# Patient Record
Sex: Female | Born: 1975 | Race: White | Hispanic: No | Marital: Married | Smoking: Never smoker
Health system: Southern US, Community
[De-identification: ages and names within clinical notes are randomized; demographics above are authoritative.]

## PROBLEM LIST (undated history)

## (undated) DIAGNOSIS — K859 Acute pancreatitis without necrosis or infection, unspecified: Secondary | ICD-10-CM

## (undated) DIAGNOSIS — O039 Complete or unspecified spontaneous abortion without complication: Secondary | ICD-10-CM

## (undated) DIAGNOSIS — E039 Hypothyroidism, unspecified: Secondary | ICD-10-CM

## (undated) DIAGNOSIS — R011 Cardiac murmur, unspecified: Secondary | ICD-10-CM

## (undated) DIAGNOSIS — Z8489 Family history of other specified conditions: Secondary | ICD-10-CM

## (undated) HISTORY — PX: MOHS SURGERY: SUR867

## (undated) HISTORY — PX: ENDOMETRIAL ABLATION: SHX621

## (undated) HISTORY — PX: DILATION AND CURETTAGE OF UTERUS: SHX78

## (undated) HISTORY — DX: Complete or unspecified spontaneous abortion without complication: O03.9

---

## 1998-06-14 ENCOUNTER — Other Ambulatory Visit: Admission: RE | Admit: 1998-06-14 | Discharge: 1998-06-14 | Payer: Self-pay | Admitting: Obstetrics and Gynecology

## 1999-05-27 ENCOUNTER — Inpatient Hospital Stay (HOSPITAL_COMMUNITY): Admission: AD | Admit: 1999-05-27 | Discharge: 1999-05-27 | Payer: Self-pay | Admitting: Obstetrics and Gynecology

## 1999-11-06 ENCOUNTER — Inpatient Hospital Stay (HOSPITAL_COMMUNITY): Admission: AD | Admit: 1999-11-06 | Discharge: 1999-11-08 | Payer: Self-pay | Admitting: Obstetrics and Gynecology

## 1999-11-08 ENCOUNTER — Encounter: Payer: Self-pay | Admitting: Obstetrics & Gynecology

## 1999-11-23 ENCOUNTER — Inpatient Hospital Stay (HOSPITAL_COMMUNITY): Admission: AD | Admit: 1999-11-23 | Discharge: 1999-11-27 | Payer: Self-pay | Admitting: Obstetrics and Gynecology

## 1999-11-25 ENCOUNTER — Encounter: Payer: Self-pay | Admitting: Obstetrics and Gynecology

## 1999-11-27 ENCOUNTER — Encounter: Payer: Self-pay | Admitting: Obstetrics and Gynecology

## 1999-12-08 ENCOUNTER — Encounter: Payer: Self-pay | Admitting: Obstetrics and Gynecology

## 1999-12-08 ENCOUNTER — Inpatient Hospital Stay (HOSPITAL_COMMUNITY): Admission: AD | Admit: 1999-12-08 | Discharge: 1999-12-08 | Payer: Self-pay | Admitting: Obstetrics and Gynecology

## 1999-12-10 ENCOUNTER — Inpatient Hospital Stay (HOSPITAL_COMMUNITY): Admission: AD | Admit: 1999-12-10 | Discharge: 1999-12-10 | Payer: Self-pay | Admitting: Obstetrics and Gynecology

## 1999-12-20 ENCOUNTER — Inpatient Hospital Stay (HOSPITAL_COMMUNITY): Admission: AD | Admit: 1999-12-20 | Discharge: 1999-12-23 | Payer: Self-pay | Admitting: Obstetrics and Gynecology

## 2001-07-24 ENCOUNTER — Other Ambulatory Visit: Admission: RE | Admit: 2001-07-24 | Discharge: 2001-07-24 | Payer: Self-pay | Admitting: Obstetrics and Gynecology

## 2002-09-14 ENCOUNTER — Other Ambulatory Visit: Admission: RE | Admit: 2002-09-14 | Discharge: 2002-09-14 | Payer: Self-pay | Admitting: Obstetrics and Gynecology

## 2003-05-07 ENCOUNTER — Emergency Department (HOSPITAL_COMMUNITY): Admission: EM | Admit: 2003-05-07 | Discharge: 2003-05-07 | Payer: Self-pay | Admitting: Emergency Medicine

## 2003-09-22 ENCOUNTER — Other Ambulatory Visit: Admission: RE | Admit: 2003-09-22 | Discharge: 2003-09-22 | Payer: Self-pay | Admitting: Obstetrics and Gynecology

## 2003-12-25 ENCOUNTER — Emergency Department (HOSPITAL_COMMUNITY): Admission: EM | Admit: 2003-12-25 | Discharge: 2003-12-26 | Payer: Self-pay | Admitting: Emergency Medicine

## 2004-01-05 ENCOUNTER — Encounter: Admission: RE | Admit: 2004-01-05 | Discharge: 2004-01-05 | Payer: Self-pay | Admitting: Family Medicine

## 2004-01-11 ENCOUNTER — Ambulatory Visit (HOSPITAL_COMMUNITY): Admission: RE | Admit: 2004-01-11 | Discharge: 2004-01-11 | Payer: Self-pay | Admitting: Family Medicine

## 2004-01-14 HISTORY — PX: LAPAROSCOPIC CHOLECYSTECTOMY: SUR755

## 2004-01-19 ENCOUNTER — Observation Stay (HOSPITAL_COMMUNITY): Admission: RE | Admit: 2004-01-19 | Discharge: 2004-01-20 | Payer: Self-pay | Admitting: General Surgery

## 2004-01-19 ENCOUNTER — Encounter (INDEPENDENT_AMBULATORY_CARE_PROVIDER_SITE_OTHER): Payer: Self-pay | Admitting: Specialist

## 2005-08-14 ENCOUNTER — Ambulatory Visit: Payer: Self-pay | Admitting: Family Medicine

## 2005-08-31 ENCOUNTER — Ambulatory Visit: Payer: Self-pay | Admitting: Family Medicine

## 2005-10-15 HISTORY — PX: ANTERIOR AND POSTERIOR REPAIR: SHX1172

## 2005-10-18 ENCOUNTER — Ambulatory Visit: Payer: Self-pay | Admitting: Family Medicine

## 2005-10-31 ENCOUNTER — Observation Stay (HOSPITAL_COMMUNITY): Admission: RE | Admit: 2005-10-31 | Discharge: 2005-11-01 | Payer: Self-pay | Admitting: Obstetrics and Gynecology

## 2006-09-17 ENCOUNTER — Ambulatory Visit: Payer: Self-pay | Admitting: Family Medicine

## 2007-08-08 DIAGNOSIS — J309 Allergic rhinitis, unspecified: Secondary | ICD-10-CM | POA: Insufficient documentation

## 2008-02-26 ENCOUNTER — Telehealth: Payer: Self-pay | Admitting: Family Medicine

## 2008-02-27 ENCOUNTER — Telehealth (INDEPENDENT_AMBULATORY_CARE_PROVIDER_SITE_OTHER): Payer: Self-pay | Admitting: *Deleted

## 2008-03-03 ENCOUNTER — Ambulatory Visit: Payer: Self-pay | Admitting: Family Medicine

## 2008-03-03 DIAGNOSIS — L258 Unspecified contact dermatitis due to other agents: Secondary | ICD-10-CM

## 2008-03-03 DIAGNOSIS — K8020 Calculus of gallbladder without cholecystitis without obstruction: Secondary | ICD-10-CM | POA: Insufficient documentation

## 2008-03-09 ENCOUNTER — Telehealth: Payer: Self-pay | Admitting: *Deleted

## 2008-03-09 ENCOUNTER — Ambulatory Visit: Payer: Self-pay | Admitting: Family Medicine

## 2008-03-12 ENCOUNTER — Telehealth: Payer: Self-pay | Admitting: Family Medicine

## 2008-03-13 ENCOUNTER — Emergency Department (HOSPITAL_COMMUNITY): Admission: EM | Admit: 2008-03-13 | Discharge: 2008-03-13 | Payer: Self-pay | Admitting: Emergency Medicine

## 2008-03-15 ENCOUNTER — Ambulatory Visit: Payer: Self-pay | Admitting: Family Medicine

## 2008-03-15 LAB — CONVERTED CEMR LAB
Blood in Urine, dipstick: NEGATIVE
Ketones, urine, test strip: NEGATIVE
Nitrite: NEGATIVE
Protein, U semiquant: NEGATIVE
Specific Gravity, Urine: <1.005
Urobilinogen, UA: 0.2
WBC Urine, dipstick: NEGATIVE

## 2008-04-12 ENCOUNTER — Ambulatory Visit: Payer: Self-pay | Admitting: Family Medicine

## 2008-04-12 DIAGNOSIS — L509 Urticaria, unspecified: Secondary | ICD-10-CM | POA: Insufficient documentation

## 2008-04-12 DIAGNOSIS — N63 Unspecified lump in unspecified breast: Secondary | ICD-10-CM | POA: Insufficient documentation

## 2008-04-12 LAB — CONVERTED CEMR LAB
ALT: 13 units/L (ref 0–35)
Anti Nuclear Antibody(ANA): NEGATIVE
Basophils Absolute: 0.1 10*3/uL (ref 0.0–0.1)
Basophils Relative: 0.9 % (ref 0.0–1.0)
CO2: 29 meq/L (ref 19–32)
Calcium: 9.1 mg/dL (ref 8.4–10.5)
Creatinine, Ser: 0.9 mg/dL (ref 0.4–1.2)
GFR calc Af Amer: 93 mL/min
Glucose, Bld: 110 mg/dL — ABNORMAL HIGH (ref 70–99)
HCT: 39.6 % (ref 36.0–46.0)
Hemoglobin: 13.7 g/dL (ref 12.0–15.0)
IgE (Immunoglobulin E), Serum: 9.4 intl units/mL (ref 0.0–180.0)
Lymphocytes Relative: 30.8 % (ref 12.0–46.0)
MCHC: 34.6 g/dL (ref 30.0–36.0)
Monocytes Absolute: 0.5 10*3/uL (ref 0.1–1.0)
Neutro Abs: 4.2 10*3/uL (ref 1.4–7.7)
RBC: 4.42 M/uL (ref 3.87–5.11)
RDW: 12.6 % (ref 11.5–14.6)
Sodium: 141 meq/L (ref 135–145)
Total Bilirubin: 0.7 mg/dL (ref 0.3–1.2)
Total Protein: 7.3 g/dL (ref 6.0–8.3)
hCG, Beta Chain, Quant, S: 1.18 milliintl units/mL

## 2008-04-15 ENCOUNTER — Encounter: Admission: RE | Admit: 2008-04-15 | Discharge: 2008-04-15 | Payer: Self-pay | Admitting: Family Medicine

## 2008-04-20 ENCOUNTER — Telehealth: Payer: Self-pay | Admitting: Family Medicine

## 2008-06-17 ENCOUNTER — Telehealth: Payer: Self-pay | Admitting: Family Medicine

## 2008-06-22 ENCOUNTER — Ambulatory Visit (HOSPITAL_COMMUNITY): Admission: RE | Admit: 2008-06-22 | Discharge: 2008-06-22 | Payer: Self-pay | Admitting: Obstetrics and Gynecology

## 2008-07-31 ENCOUNTER — Inpatient Hospital Stay (HOSPITAL_COMMUNITY): Admission: AD | Admit: 2008-07-31 | Discharge: 2008-07-31 | Payer: Self-pay | Admitting: Obstetrics

## 2008-12-27 DIAGNOSIS — J020 Streptococcal pharyngitis: Secondary | ICD-10-CM

## 2008-12-30 ENCOUNTER — Ambulatory Visit: Payer: Self-pay | Admitting: Family Medicine

## 2008-12-30 DIAGNOSIS — J029 Acute pharyngitis, unspecified: Secondary | ICD-10-CM

## 2009-02-18 ENCOUNTER — Telehealth: Payer: Self-pay | Admitting: Family Medicine

## 2009-02-21 ENCOUNTER — Telehealth: Payer: Self-pay | Admitting: Speech Pathology

## 2010-05-15 DIAGNOSIS — D229 Melanocytic nevi, unspecified: Secondary | ICD-10-CM

## 2010-05-15 HISTORY — DX: Melanocytic nevi, unspecified: D22.9

## 2010-11-29 ENCOUNTER — Encounter (INDEPENDENT_AMBULATORY_CARE_PROVIDER_SITE_OTHER): Payer: Self-pay | Admitting: *Deleted

## 2010-11-29 ENCOUNTER — Other Ambulatory Visit: Payer: Self-pay | Admitting: Family Medicine

## 2010-11-29 ENCOUNTER — Other Ambulatory Visit: Payer: PRIVATE HEALTH INSURANCE

## 2010-11-29 DIAGNOSIS — Z0000 Encounter for general adult medical examination without abnormal findings: Secondary | ICD-10-CM

## 2010-11-29 LAB — CBC WITH DIFFERENTIAL/PLATELET
Basophils Relative: 0.4 % (ref 0.0–3.0)
Eosinophils Relative: 5.1 % — ABNORMAL HIGH (ref 0.0–5.0)
HCT: 39.2 % (ref 36.0–46.0)
Hemoglobin: 13.5 g/dL (ref 12.0–15.0)
Lymphs Abs: 1.7 10*3/uL (ref 0.7–4.0)
MCHC: 34.5 g/dL (ref 30.0–36.0)
MCV: 89.2 fl (ref 78.0–100.0)
Monocytes Absolute: 0.6 10*3/uL (ref 0.1–1.0)
Neutro Abs: 3.6 10*3/uL (ref 1.4–7.7)
Neutrophils Relative %: 57.4 % (ref 43.0–77.0)
RBC: 4.4 Mil/uL (ref 3.87–5.11)
WBC: 6.3 10*3/uL (ref 4.5–10.5)

## 2010-11-29 LAB — LIPID PANEL
LDL Cholesterol: 79 mg/dL (ref 0–99)
Total CHOL/HDL Ratio: 3

## 2010-11-29 LAB — URINALYSIS
Bilirubin Urine: NEGATIVE
Nitrite: NEGATIVE
Total Protein, Urine: NEGATIVE
Urobilinogen, UA: 0.2 (ref 0.0–1.0)

## 2010-11-29 LAB — HEPATIC FUNCTION PANEL
ALT: 10 U/L (ref 0–35)
Albumin: 3.6 g/dL (ref 3.5–5.2)
Bilirubin, Direct: 0.1 mg/dL (ref 0.0–0.3)
Total Protein: 6.4 g/dL (ref 6.0–8.3)

## 2010-11-29 LAB — BASIC METABOLIC PANEL
CO2: 29 mEq/L (ref 19–32)
Chloride: 105 mEq/L (ref 96–112)
Potassium: 4.8 mEq/L (ref 3.5–5.1)

## 2010-12-04 ENCOUNTER — Ambulatory Visit (INDEPENDENT_AMBULATORY_CARE_PROVIDER_SITE_OTHER): Payer: PRIVATE HEALTH INSURANCE | Admitting: Family Medicine

## 2010-12-04 ENCOUNTER — Encounter: Payer: Self-pay | Admitting: Family Medicine

## 2010-12-04 DIAGNOSIS — Z23 Encounter for immunization: Secondary | ICD-10-CM

## 2010-12-04 DIAGNOSIS — Z0000 Encounter for general adult medical examination without abnormal findings: Secondary | ICD-10-CM

## 2010-12-04 DIAGNOSIS — J309 Allergic rhinitis, unspecified: Secondary | ICD-10-CM

## 2010-12-04 DIAGNOSIS — E039 Hypothyroidism, unspecified: Secondary | ICD-10-CM

## 2010-12-04 DIAGNOSIS — L509 Urticaria, unspecified: Secondary | ICD-10-CM

## 2010-12-04 MED ORDER — LEVOTHYROXINE SODIUM 75 MCG PO TABS: 75.0000 ug | ORAL_TABLET | Freq: Every day | ORAL | Status: DC

## 2010-12-04 NOTE — Progress Notes (Signed)
  Subjective:    Patient ID: Kayla Mccarthy, female    DOB: 1976-04-09, 35 y.o.   MRN: [id number]  HPI Annaleigh is a delightful 35 year old, married female, nonsmoker, who comes in today for general medical examination.  She has a history of hypothyroidism.  She is on Synthroid one daily.  TSH level normal.  She has a history of urticaria, etiology unknown.  Allergy workup was nondiagnostic.  She takes Zyrtec 10 mg nightly and this has mostly alleviated her symptoms.  She gets routine eye care, dental care, BSE monthly, she did have a mammogram a couple years ago, when she had a breast cyst.  Mammogram showed a cystic lesion that is since reabsorbed and is no longer present.  Pap done by GYN.  Also as she sees a dermatologist once or twice a year for skin exam because of some history of some dysplastic nevi.  No birth control.  She is not gotten pregnant in over 10 years   Review of Systems  Constitutional: Negative.   HENT: Negative.   Eyes: Negative.   Respiratory: Negative.   Cardiovascular: Negative.   Gastrointestinal: Negative.   Genitourinary: Negative.   Musculoskeletal: Negative.   Neurological: Negative.   Hematological: Negative.   Psychiatric/Behavioral: Negative.        Objective:   Physical Exam  Constitutional: She appears well-developed and well-nourished.  HENT:  Head: Normocephalic and atraumatic.  Right Ear: External ear normal.  Left Ear: External ear normal.  Nose: Nose normal.  Mouth/Throat: Oropharynx is clear and moist.  Eyes: EOM are normal. Pupils are equal, round, and reactive to light.  Neck: Normal range of motion. Neck supple. No thyromegaly present.  Cardiovascular: Normal rate, regular rhythm, normal heart sounds and intact distal pulses.  Exam reveals no gallop and no friction rub.   No murmur heard. Pulmonary/Chest: Effort normal and breath sounds normal.  Abdominal: Soft. Bowel sounds are normal. She exhibits no distension and no mass. There  is no tenderness. There is no rebound.  Musculoskeletal: Normal range of motion.  Lymphadenopathy:    She has no cervical adenopathy.  Neurological: She is alert. She has normal reflexes. No cranial nerve deficit. She exhibits normal muscle tone. Coordination normal.  Skin: Skin is warm and dry.       Total body skin exam normal.  She has a garden variety of freckles moles skin tags.  Capillary hemangiomas and a blue nevus on her right breast.  Nothing that looks abnormal  Psychiatric: She has a normal mood and affect. Her behavior is normal. Judgment and thought content normal.          Assessment & Plan:  Healthy female.  History of hypothyroidism.  Continue levothyroxine.  History of urticaria and allergic rhinitis, etiology unknown.  Plan continue the Zyrtec, 10 mg nightly  History of dysplastic nevi.  Plan wear sunscreens continue to monitor skin, carefully

## 2010-12-04 NOTE — Patient Instructions (Signed)
Continue current medications follow-up in one year or sooner if any problems.  Remember to do a good breast exam monthly and also check your skin, monthly

## 2010-12-08 ENCOUNTER — Telehealth: Payer: Self-pay | Admitting: Family Medicine

## 2010-12-08 NOTE — Telephone Encounter (Signed)
Pt called to check on status of physical form that she had faxed yesterday for Dr Tawanna Cooler to sign off on.... Pt can be reached at  [phone] when form is ready for p/u..... Or Form can be faxed to [phone]  When completed.

## 2010-12-11 NOTE — Telephone Encounter (Signed)
patient  Is aware that form has been faxed and copy mailed to home

## 2010-12-26 ENCOUNTER — Ambulatory Visit: Payer: PRIVATE HEALTH INSURANCE | Admitting: Family Medicine

## 2010-12-26 DIAGNOSIS — Z0289 Encounter for other administrative examinations: Secondary | ICD-10-CM

## 2011-01-05 ENCOUNTER — Ambulatory Visit (HOSPITAL_COMMUNITY)
Admission: RE | Admit: 2011-01-05 | Discharge: 2011-01-05 | Disposition: A | Discharge status: Home / Self Care | Payer: PRIVATE HEALTH INSURANCE | Source: Ambulatory Visit | Attending: Obstetrics and Gynecology | Admitting: Obstetrics and Gynecology

## 2011-01-05 ENCOUNTER — Other Ambulatory Visit: Payer: Self-pay | Admitting: Obstetrics and Gynecology

## 2011-01-05 DIAGNOSIS — N920 Excessive and frequent menstruation with regular cycle: Secondary | ICD-10-CM | POA: Insufficient documentation

## 2011-01-05 DIAGNOSIS — N840 Polyp of corpus uteri: Secondary | ICD-10-CM | POA: Insufficient documentation

## 2011-01-05 LAB — CBC
HCT: 41.5 % (ref 36.0–46.0)
MCHC: 33 g/dL (ref 30.0–36.0)
MCV: 89.1 fL (ref 78.0–100.0)
RDW: 12.8 % (ref 11.5–15.5)

## 2011-02-02 NOTE — Op Note (Signed)
  [name], Kayla Mccarthy                ACCOUNT NO.:  [id number]  MEDICAL RECORD NO.:  [id number]           PATIENT TYPE:  O  LOCATION:  [facility]                          FACILITY:  [facility]  PHYSICIAN:  Lenoard Aden, M.D.DATE OF BIRTH:  [birth year]  DATE OF PROCEDURE:  01/05/2011 DATE OF DISCHARGE:                              OPERATIVE REPORT   PREOPERATIVE DIAGNOSIS:  Refractory menometrorrhagia.  POSTOPERATIVE DIAGNOSIS:  Refractory menometrorrhagia.  PROCEDURE:  Diagnostic hysteroscopy, D and C, endometrial polypectomy, NovaSure endometrial ablation.  SURGEON:  Lenoard Aden, MD  ASSISTANT:  None.  ANESTHESIA:  General and local.  ESTIMATED BLOOD LOSS:  Less than 50 mL.  FLUID DEFICIT:  35 mL.  COMPLICATIONS:  None.  DRAINS:  None.  COUNTS:  Correct.  The patient recovery in good condition.  SPECIMEN:  Endometrial curettings and polyp to pathology.  BRIEF OPERATIVE NOTE:  After being apprised of risks of anesthesia, infection, bleeding, injury to abdominal organs, need for repair, delayed versus immediate complications to include bowel and bladder injury, possible need for repair.  The patient was brought to the operating room where she was administered a general anesthetic without complications, prepped and draped in sterile fashion, catheterized until the bladder was empty.  Exam under anesthesia reveals an anteflexed, normal size uterus with no adnexal masses.  A single-tooth tenaculum was placed.  A dilute paracervical block placed with a 20 mL of dilute Marcaine solution, at this time cervix was easily dilated up to #21 Pratt dilator.  Hysteroscope placed.  Visualization reveals a right lateral wall endometrial polyp which was removed using polyp forceps and sent with the endometrial curetting specimen which was then collected using curettage in a four-quadrant method.  Good hemostasis was noted. Visualization reveals a clear endometrial cavity, at this time,  the endometrial device was placed for a cavity length of 6.5 cavity width of 4.7.  CO2 test was negative.  NovaSure device was then initiated. Procedure was started whereby the power was 268 watts/80 seconds.  At the end of the procedure, the device was removed, inspected and found to be intact.  Revisualization of the endometrial cavity reveals a well-ablated cavity and no evidence of uterine perforation.  Good hemostasis was noted.  The patient tolerated the procedure well.  All instruments removed.  The patient was transferred to recovery room in good condition.     Lenoard Aden, M.D.     RJT/MEDQ  D:  01/05/2011  T:  01/06/2011  Job:  [id number]  Electronically Signed by Olivia Mackie M.D. on 02/02/2011 03:16:30 PM

## 2011-02-02 NOTE — H&P (Signed)
  NAMEJAMMI, Kayla Mccarthy                ACCOUNT NO.:  [id number]  MEDICAL RECORD NO.:  [id number]           PATIENT TYPE:  LOCATION:                                 FACILITY:  PHYSICIAN:  Lenoard Aden, M.D.DATE OF BIRTH:  01/11/1976  DATE OF ADMISSION: DATE OF DISCHARGE:                             HISTORY & PHYSICAL   CHIEF COMPLAINT:  Refractory menometrorrhagia.  She is a 35 year old white female, G5, P2, who presents now for management of continued menometrorrhagia.  She has had normal lab work to include normal TSH and prolactin, and a ultrasound revealing otherwise normal-appearing endometrial cavity with no evidence of structural lesions and a normal adnexa.  She has medications to include Zyrtec as needed, thyroid supplementation, and vitamin B.  She has allergies to Klonopin, no known latex allergies.  Family history of heart disease and kidney cancer.  She has a history of two vaginal deliveries.  She has a history of an anterior colporrhaphy in 2007 and cholecystectomy.  PHYSICAL EXAMINATION:  GENERAL:  She is a well-developed, well-nourished white female. VITAL SIGNS:  Weight of 173.  Height of 5 feet 5 inches. HEENT:  Normal. NECK:  Supple.  Full range of motion. LUNGS:  Clear. ABDOMEN:  Soft, nontender.  Uterus is small, anteflexed.  No adnexal masses. EXTREMITIES:  There are no cords. NEUROLOGIC:  Nonfocal. SKIN:  Intact.  IMPRESSION:  Symptomatic menometrorrhagia.  PLAN:  Proceed with diagnostic hysteroscopy, D and C, NovaSure endometrial ablation.  Risks of anesthesia, infection, bleeding, injury to abdominal organs, need for repair discussed, delayed versus immediate complications to include bowel or bladder injury noted.  Amenorrhea rate of approximately 70-75% and 5 years escorted.     Lenoard Aden, M.D.     RJT/MEDQ  D:  01/04/2011  T:  01/04/2011  Job:  [id number]  Electronically Signed by Olivia Mackie M.D. on 02/02/2011 03:16:23  PM

## 2011-02-27 NOTE — Assessment & Plan Note (Signed)
[facility]                                 ON-CALL NOTE   RINNAH, PEPPEL                         MRN:          [id number]  DATE:03/07/2008                            DOB:          [birth year]    Time of call 5:37 pm.   PHONE NUMBER:  [phone].   OBJECTIVE:  The patient is having an allergic reaction, was seen by Dr.  Tawanna Cooler last week, and is finishing her steroids.  As she is tapering, her  symptoms are coming back.  She was on prednisone 40 mg for three days  and now has been on 20 mg yesterday and today.  Yesterday, her symptoms  were milder, but today more pronounced with ears swollen and arms  blotchy red.  Neck is tender.  Being in the shower, the pressure of the  water causes her skin to hurt.  Her tongue is tingly.  She has itchiness  on the face and mild numbness.  The throat feels swollen to her, one  side worse than the other.  She is on Claritin regularly and has been  for years.  Her history is significant as far as she can tell, and as  far as my questioning, only significant for the fact that she started  Clomid last month.  She had her second dose approximately 10 days ago  now, which would be consistent with about the time that her symptoms  started.   ASSESSMENT:  Allergic reaction.   PLAN:  I suggest that she switch Claritin to Zyrtec.  Add Pepcid 40 mg  b.i.d.  If she has breathing problems, go to the emergency room.  If  things are no better by Tuesday, go in and be seen by Dr. Tawanna Cooler again and  consider not taking the Clomid for few months, to see if her symptoms  resolve, could then give herself a challenge again if she so desired.   Primary care Luiz Trumpower is Dr. Tawanna Cooler and home office is [place].     Arta Silence, MD  Electronically Signed    RNS/MedQ  DD: 03/07/2008  DT: 03/08/2008  Job #: [id number]

## 2011-03-02 NOTE — Op Note (Signed)
Kayla Mccarthy, Kayla Mccarthy                          ACCOUNT NO.:  [id number]   MEDICAL RECORD NO.:  [id number]                   PATIENT TYPE:  OBV   LOCATION:  0448                                 FACILITY:  [facility]   PHYSICIAN:  Leonie Man, M.D.                DATE OF BIRTH:  1976-09-04   DATE OF PROCEDURE:  01/19/2004  DATE OF DISCHARGE:  01/20/2004                                 OPERATIVE REPORT   PREOPERATIVE DIAGNOSES:  Biliary dyskinesia.   POSTOPERATIVE DIAGNOSES:  Biliary dyskinesia.   PROCEDURE:  Laparoscopic cholecystectomy with attempted cholangiogram.   SURGEON:  Leonie Man, M.D.   ASSISTANT:  Nurse.   ANESTHESIA:  General.   The patient is a 35 year old female who developed recurrent upper abdominal  pain with radiation to the right subscapular region.  This was associated  with nausea and vomiting and usually occurred following fat intake.  She  underwent a workup for cholelithiasis which was negative. Her symptoms were  continued and she had a HIDA scan with CCK stimulation. The HIDA scan showed  that she had only an 8% gallbladder ejection fraction for diagnosis of  biliary dyskinesia. She comes to the operating room now after the risk and  potential benefits of surgery have been fully discussed with her. All  questions answered and consent obtained.   DESCRIPTION OF PROCEDURE:  Following the induction of satisfactory general  anesthesia, the patient's positioned supinely. The abdomen is routinely  prepped and draped to be included in a sterile operative field.  Open  laparoscopy is created through an infraumbilical incision with insertion of  a Hasson type cannula. The peritoneum is inflated to 14-15 mmHg pressure  using carbon dioxide. The camera is inserted and visual exploration of the  abdomen carried out. The gallbladder showed some mild chronic scarring but  without any significant adhesions.  The liver edges were sharp, liver  surfaces smooth. The  anterior gastric wall and duodenal sweep appeared to be  normal. None of the small or large intestine appeared to be abnormal.  Under  direct vision, the epigastric and lateral ports were placed. The gallbladder  was grasped and retracted cephalad. Dissection was carried down at the  region of the ampulla of the gallbladder with isolation of the cystic duct  and of the cystic artery.  The cystic duct was __________ proximally and  opened with good return of bile. There were no stones within the cystic duct  on milking of the bile duct. A Cook catheter was passed into the abdomen;  however, a valve at the cystic duct it was not possible to pass the catheter  distally.  This was removed and Reddick catheter then tried. The Reddick  catheter was also not able to pass therefore an attempt at cholangiogram was  discontinued.  The cystic duct was freed completely and the window was  between the cystic duct and the liver  was noted to be wide without any  evidence of any structures behind it. The common duct could be clearly seen.  The cystic duct was then doubly clipped and then transected. The cystic  artery was isolated and doubly clipped and then transected. The gallbladder  was then dissected free from the liver bed using electrocautery. During the  course of the dissection, there was a focal small hole made in the  gallbladder.  There were no stones within the gallbladder. The gallbladder  was dissected free, it was placed in an Endopouch. The right upper quadrant  was then thoroughly irrigated, all the areas of dissection in the liver bed  checked for hemostasis and noted to be dry.  The camera was then turned to  the epigastric port and the gallbladder was retrieved through the umbilical  port without difficulty. The right upper quadrant was then thoroughly  irrigated with normal saline and aspirated dry. Sponge, instrument and sharp  counts were verified and the wounds closed in layers as  follows. The  umbilical wound was closed in two layers with #0 Dexon and 4-0 Dexon.  Epigastric and lateral flank.   WOUNDS:  Were closed with 4-0 Monocryl suture. The wounds were then  reinforced with Steri-Strips and sterile dressings were applied. The  anesthetic reversed and the patient removed from the operating room to the  recovery room in stable condition. She tolerated the procedure well.                                               Leonie Man, M.D.    PB/MEDQ  D:  01/19/2004  T:  01/20/2004  Job:  [id number]   cc:   Tinnie Gens A. Tawanna Cooler, M.D. [facility]

## 2011-03-02 NOTE — H&P (Signed)
[facility]  Patient:    Kayla Mccarthy, Kayla Mccarthy                       MRN: [id number] Adm. Date:  [id number] Attending:  Lenoard Aden                         History and Physical  CHIEF COMPLAINT:              Persistent oligohydramnios.  HISTORY OF PRESENT ILLNESS:   Patient is a 35 year old white female, G5, 1-0-3-1, who presents with worsening oligohydramnios, previously hospitalized with pregnancy for oligohydramnios.  Ultrasound has shown appropriate-for-gestational-age fetus with decreasing AFIs of 5.2, 6.2 and 6.7 today; frank breech presentation noted  today.  PAST MEDICAL HISTORY:         Remarkable for history of cystitis, history of vaginal delivery x 1, history of three pregnancy losses, three spontaneous losses with one D&C.  Otherwise, patient denies any medical or surgical hospitalizations.  FAMILY HISTORY:               Remarkable for heart disease, hypertension, tuberculosis, diabetes, thyroid disease, gastroenteritis and psychiatric disease.  ALLERGIES:                    She has no known drug allergies.  MEDICATIONS:                  Prenatal vitamins.  PRENATAL LABORATORY DATA:     Blood type A-positive.  Rh-antibody negative. VDRL nonreactive.  Rubella immune.  Hepatitis B surface antigen negative.  HIV nonreactive.  Pap smear normal.  Triple screen within normal limits.  One-hour TT performed at 28 weeks was normal.  PHYSICAL EXAMINATION:  GENERAL:                      Patient is a well-developed, well-nourished white  female in no apparent distress.  HEENT:                        Normal.  LUNGS:                        Clear.  HEART:                        Regular rhythm.  ABDOMEN:                      Soft, gravid, nontender.  Estimated fetal weight n ultrasound:  Five pounds 15 ounces.  Umbilical artery Dopplers also noted to be  normal.  BACK:                         No CVA tenderness.  PELVIC:                        Cervical exam deferred.  EXTREMITIES:                  No cords.  NEUROLOGIC:                   Nonfocal.  IMPRESSION:                   1. Thirty-three-plus-week intrauterine pregnancy.  2. Recurrent oligohydramnios.                               3. Homero Fellers breech.  PLAN:                         Admit; continuous monitoring; repeat AFI in 48 hours. DD:  11/23/99 TD:  11/23/99 Job: [id number]

## 2011-03-02 NOTE — H&P (Signed)
[facility]  Patient:    Kayla Mccarthy                        MRN: [id number] Adm. Date:  [id number] Attending:  Lenoard Aden                         History and Physical  CHIEF COMPLAINT:              Persistent oligohydramnios.  HISTORY OF PRESENT ILLNESS:   The patient is a 35 year old white female gravida V, para 1, 0, 3, 1 with an estimated date of delivery of January 06, 2000 at 31-2/7 weeks, and with a history of ongoing oligohydramnios throughout this pregnancy.  The patient is now admitted for an AFI less than the second percentile.  PAST MEDICAL HISTORY:         Past medical history is remarkable for a history f cystitis.  No other medical or surgical hospitalizations.  FAMILY HISTORY:               Family history is remarkable for heart disease, hypertension, tuberculosis, diabetes, thyroid disease, gastroenteritis, and psychiatric disease.  PAST OBSTETRICAL HISTORY:     Past obstetrical history is remarkable for thee uncomplicated first trimester and miscarriages, and one induced vaginal delivery of an 8 pound 1 ounce female in 1998; induction for oligohydramnios and pregnancy was also complicated by mild pregnancy-induced hypertension.  PRENATAL LABORATORY DATA:     Blood type A positive, Rh antibody negative. VDRL nonreactive.  Rubella immune.  Hepatitis B surface antigen negative.  HIV nonreactive.  Pap smear normal.  Triple screen normal. One-hour glucose tolerance test performed was 82.  The patient had an ultrasound performed today, which revealed an appropriate for gestational age fetus at 4 pounds 7 ounces, AFI of .7, 6.3 and 6.4; all less than the second percentile.  SOCIAL HISTORY:               Social history is otherwise noncontributory.  The  patient denies domestic or physical violence.  PHYSICAL EXAMINATION:  GENERAL APPEARANCE:           On physical examination the patient is a well-developed,  well-nourished white female in no apparent distress.  HEENT:                        Head, eyes, ears, nose, and throat normal.  LUNGS:                        Lungs clear.  HEART:                        Regular rate and rhythm.  ABDOMEN:                      Abdomen soft, gravid and nontender.  Estimated fetal weight 4.5 pounds.  VAGINAL EXAMINATION:          Cervix closed, 2.5-3 cm long and floating.  EXTREMITIES:                  Extremities show no cords.  NEUROLOGIC:                   Neurological examination is nonfocal.  IMPRESSION:  1. Thirty-one-and-two-sevenths weeks intrauterine                                  pregnancy.                               2. Persistent oligohydramnios.                               3. History of oligohydramnios and pregnancy-induced                                  hypertension in previous pregnancy.  PLAN:                         1. Admit.                               2. Bedrest with bathroom privileges.                               3. Intravenous fluid hydration.                               4. Repeat AFI at 48 hours.                               5. Betamethasone to be given. DD:  11/06/99 TD:  11/06/99 Job: [id number]

## 2011-03-02 NOTE — Discharge Summary (Signed)
[facility]  Patient:    Kayla Mccarthy, Kayla Mccarthy                       MRN: [id number] Adm. Date:  [id number] Disc. Date: [id number] Attending:  Lenoard Aden                           Discharge Summary  DISCHARGE DIAGNOSES:          1. Intrauterine pregnancy at term.                               2. Unfavorable cervix.                               3. Oligohydramnios.                               4. Pregnancy-induced hypertension.  PROCEDURE:                    Spontaneous vaginal delivery, December 21, 1999. Vaginal Cytotec, December 20, 1999.  Pitocin induction, December 21, 1999.  HISTORY OF PRESENT ILLNESS:   A 35 year old woman, gravida 5, para 1-0-3-1, who  experienced recurrent oligohydramnios at term.  The patient had been previously  hospitalized for oligohydramnios.  Blood pressure was elevated in the office on  December 20, 1999, and the decision was made to induce.  EDC of January 06, 2000.  HOSPITAL COURSE:              The patients cervix was unfavorable.  She was admitted to [facility] [place] on December 20, 1999.  Cytotec was used to ripen her cervix.  Pitocin was initiated the following morning.  Labor progressed well with a second stage of 11 minutes.  Epidural had been given during the active phase of labor.  She was delivered spontaneously of a live female, 3610 grams with Apgars of 9 and 9, over an intact perineum.  Estimated blood loss was 350 cc and there were no complications.  The patients postpartum course was unremarkable.  Her hemoglobin stabilized at 10.3.  This was well tolerated.  Pregnancy-induced hypertension laboratory studies have previously been normal.  She chose to bottlefeed.  She was discharged to home in satisfactory condition on December 23, 1999.  Routine status post vaginal delivery instructions were given.  Follow-up will be in the office in four to six weeks ith Lenoard Aden, M.D.  DISCHARGE MEDICATIONS:         1. Prenatal vitamins.                               2. Iron.                               3. Nonsteroidal anti-inflammatory agents.                               4. She was given a prescription for Lo-Ovral, but was  asked not to take it until she was seen by                                  Lenoard Aden, M.D. DD:  01/13/00 TD:  01/13/00 Job: [id number]

## 2011-03-02 NOTE — Op Note (Signed)
NAMESINA, Kayla Mccarthy                ACCOUNT NO.:  [id number]   MEDICAL RECORD NO.:  [id number]          PATIENT TYPE:  OBV   LOCATION:  9307                          FACILITY:  [facility]   PHYSICIAN:  Lenoard Aden, M.D.DATE OF BIRTH:  29-Jul-1976   DATE OF PROCEDURE:  10/31/2005  DATE OF DISCHARGE:                                 OPERATIVE REPORT   PREOPERATIVE DIAGNOSIS:  Symptomatic cystocele and rectocele.   POSTOPERATIVE DIAGNOSES:  1.  Symptomatic cystocele and rectocele.  2.  Enterocele.   PROCEDURE:  1.  Anterior colporrhaphy.  2.  Posterior colporrhaphy.  3.  Anterior repair.  4.  Perineorrhaphy.   ASSISTANT:  Genia Del, M.D.   ANESTHESIA:  General.   ESTIMATED BLOOD LOSS:  100 mL.   COMPLICATIONS:  None.   DRAINS:  Foley catheter.   COUNTS:  Correct.   DISPOSITION:  Patient to recovery in good condition.   BRIEF OPERATIVE NOTE:  Being apprised of the risks of anesthesia, infection,  bleeding, injury to bowel and bladder, and possible need for repair, she was  brought to the operating room where she was administered general anesthetic  without complications.  Was prepped and draped in the usual sterile fashion.  Foley catheter placed.  Cystocele was identified.  Anterior vaginal mucosa  was undermined using a dilute Xylocaine and epinephrine solution and  anterior and posterior portion of the cystocele grasped using Allis clamps  down to the cervical vaginal junction.  The anterior vaginal mucosa is  incised and the pubovesicocervical fascia is dissected sharply off the  anterior vaginal wall.  This is then reduced sharply using Strully scissors.  The cystocele was then purse-stringed using 0 Vicryl suture x2 and reduced  using 0 Vicryl suture.  The vagina is trimmed and defect is closed using 3-0  Vicryl in an interrupted fashion.  Attention was turned to the rectocele,  which was identified by doing rectal exam and tensioning the anterior most  portion which was then marked with an Allis clamp.  Tissue was undermined.  Perineal relaxation was noted.  The enterocele is also suspected at this  time.  The triangular wedge of tissue was removed on the perineum and the  posterior vaginal wall mucosa is undermined in the standard fashion and  incised in the midline and the rectovaginal fascia is dissected sharply off  of the posterior vaginal wall.  Enterocele is identified at the apex of the  defect and this is closed using a 0 Vicryl purse-string suture x2.  Rectocele is reduced in the standard fashion using the 0 Vicryl suture.  The  vagina is trimmed.  The posterior vaginal wall is closed using 2-0 Vicryl in  an interrupted fashion.  Perineorrhaphy is then performed in the standard  fashion using a 2-0 Vicryl suture.  The patient tolerates the procedure  well.  Packing was placed.  The patient is awakened and transferred to  recovery in good condition.      Lenoard Aden, M.D.  Electronically Signed     RJT/MEDQ  D:  10/31/2005  T:  10/31/2005  Job:  [id number]

## 2011-03-02 NOTE — Discharge Summary (Signed)
[facility]  Patient:    Kayla Mccarthy, Kayla Mccarthy                       MRN: [id number] Adm. Date:  [id number] Disc. Date: [id number] Attending:  Lenoard Aden                           Discharge Summary  ADMISSION DIAGNOSIS:          Intrauterine pregnancy at 33 weeks with oligohydramnios.  HOSPITAL COURSE:              Patient underwent continuous monitoring, expectant management, aggressive IV fluid hydration.  Reaccumulated fluid nicely, increased up to an AFI of 9.5 on November 27, 1999.  DISPOSITION:                  Discharged to home.  FOLLOW-UP:                    In the office within one week.  INSTRUCTIONS:                 Fetal activity discussed.  Discharge teaching done. DD:  12/10/99 TD:  12/11/99 Job: [id number]

## 2012-03-24 ENCOUNTER — Telehealth: Payer: Self-pay | Admitting: Family Medicine

## 2012-03-24 ENCOUNTER — Ambulatory Visit (INDEPENDENT_AMBULATORY_CARE_PROVIDER_SITE_OTHER): Payer: BC Managed Care – PPO | Admitting: Family Medicine

## 2012-03-24 ENCOUNTER — Encounter: Payer: Self-pay | Admitting: Family Medicine

## 2012-03-24 VITALS — BP 110/70 | HR 80 | Temp 97.7°F | Wt 175.0 lb

## 2012-03-24 DIAGNOSIS — L509 Urticaria, unspecified: Secondary | ICD-10-CM

## 2012-03-24 DIAGNOSIS — L258 Unspecified contact dermatitis due to other agents: Secondary | ICD-10-CM

## 2012-03-24 MED ORDER — PREDNISONE 20 MG PO TABS: ORAL_TABLET | ORAL | Status: DC

## 2012-03-24 NOTE — Telephone Encounter (Signed)
Patient informed 1:15

## 2012-03-24 NOTE — Progress Notes (Signed)
  Subjective:    Patient ID: Kayla Mccarthy, female    DOB: May 13, 1976, 36 y.o.   MRN: [id number]  HPI Kayla Mccarthy is a 36 year old married female nonsmoker who comes in today for evaluation of facial swelling  She noticed yesterday he the right eye lids he came swollen and then she broke out all over her face. She's had a history of urticaria etiology unknown. Immunologic workup a couple years ago negative   Review of Systems General and dermatologic review of systems otherwise negative no systemic symptoms    Objective:   Physical Exam Well-developed well-nourished female in no acute distress examination the face shows swelling of upper and lower lids and diffuse hives throughout the facial area       Assessment & Plan:

## 2012-03-24 NOTE — Patient Instructions (Signed)
Take the prednisone as directed return when necessary 

## 2012-03-24 NOTE — Telephone Encounter (Signed)
Pt is scheduled at 11:00 today but would like to be worked in later today. Please advise  Pt requesting to be contacted at work

## 2012-04-15 ENCOUNTER — Other Ambulatory Visit: Payer: Self-pay | Admitting: Occupational Medicine

## 2012-04-15 ENCOUNTER — Ambulatory Visit: Payer: Self-pay

## 2012-04-15 DIAGNOSIS — Z9289 Personal history of other medical treatment: Secondary | ICD-10-CM

## 2013-11-30 ENCOUNTER — Ambulatory Visit (INDEPENDENT_AMBULATORY_CARE_PROVIDER_SITE_OTHER): Payer: BC Managed Care – PPO | Admitting: Internal Medicine

## 2013-11-30 ENCOUNTER — Encounter: Payer: Self-pay | Admitting: Internal Medicine

## 2013-11-30 VITALS — BP 114/80 | HR 75 | Temp 98.5°F | Resp 20 | Ht 65.0 in | Wt 191.0 lb

## 2013-11-30 DIAGNOSIS — J309 Allergic rhinitis, unspecified: Secondary | ICD-10-CM

## 2013-11-30 DIAGNOSIS — J069 Acute upper respiratory infection, unspecified: Secondary | ICD-10-CM

## 2013-11-30 NOTE — Progress Notes (Signed)
Subjective:    Patient ID: Kayla Mccarthy, female    DOB: [birth year], 38 y.o.   MRN: [id number]  HPI  38 year old patient, who presents with a four-day history of sore throat, cough, hoarseness, and low-grade fever.  She has a history of allergic rhinitis.  She has been using ibuprofen as well as lozengers.    Past Medical History  Diagnosis Date  . Allergic rhinitis   . Cholelithiasis   . Urticaria   . Miscarriage     x 2    History   Social History  . Marital Status: Married    Spouse Name: N/A    Number of Children: N/A  . Years of Education: N/A   Occupational History  . Not on file.   Social History Main Topics  . Smoking status: Never Smoker   . Smokeless tobacco: Not on file  . Alcohol Use: No  . Drug Use: No  . Sexual Activity: Not on file   Other Topics Concern  . Not on file   Social History Narrative   Regular exercise - YES    Past Surgical History  Procedure Laterality Date  . Cholecystectomy      Family History  Problem Relation Age of Onset  . Hiatal hernia Mother   . Gallbladder disease Mother   . Obesity Mother   . Crohn's disease Mother   . Hyperlipidemia Mother   . Diabetes Mother   . Irritable bowel syndrome Mother   . Hypertension Mother   . Irritable bowel syndrome Sister   . Diabetes Brother     No Known Allergies  Current Outpatient Prescriptions on File Prior to Visit  Medication Sig Dispense Refill  . cetirizine (ZYRTEC) 10 MG tablet Take 10 mg by mouth daily.        . Cholecalciferol (VITAMIN D3) 1000 UNITS capsule Take 1,000 Units by mouth daily.         No current facility-administered medications on file prior to visit.    BP 114/80  Pulse 75  Temp(Src) 98.5 F (36.9 C) (Oral)  Resp 20  Ht 5\' 5"  (1.651 m)  Wt 191 lb (86.637 kg)  BMI 31.78 kg/m2  SpO2 98%      Review of Systems  Constitutional: Positive for fever, activity change, appetite change and fatigue.  HENT: Positive for rhinorrhea, sinus  pressure, sore throat and voice change. Negative for congestion, dental problem, hearing loss and tinnitus.   Eyes: Negative for pain, discharge and visual disturbance.  Respiratory: Positive for cough. Negative for shortness of breath.   Cardiovascular: Negative for chest pain, palpitations and leg swelling.  Gastrointestinal: Negative for nausea, vomiting, abdominal pain, diarrhea, constipation, blood in stool and abdominal distention.  Genitourinary: Negative for dysuria, urgency, frequency, hematuria, flank pain, vaginal bleeding, vaginal discharge, difficulty urinating, vaginal pain and pelvic pain.  Musculoskeletal: Negative for arthralgias, gait problem and joint swelling.  Skin: Negative for rash.  Neurological: Negative for dizziness, syncope, speech difficulty, weakness, numbness and headaches.  Hematological: Negative for adenopathy.  Psychiatric/Behavioral: Negative for behavioral problems, dysphoric mood and agitation. The patient is not nervous/anxious.        Objective:   Physical Exam  Constitutional: She is oriented to person, place, and time. She appears well-developed and well-nourished.  HENT:  Head: Normocephalic.  Right Ear: External ear normal.  Left Ear: External ear normal.  Mild erythema of the oropharynx  Eyes: Conjunctivae and EOM are normal. Pupils are equal, round, and reactive to light.  Neck: Normal range of motion. Neck supple. No thyromegaly present.  Cardiovascular: Normal rate, regular rhythm, normal heart sounds and intact distal pulses.   Pulmonary/Chest: Effort normal and breath sounds normal.  Abdominal: Soft. Bowel sounds are normal. She exhibits no mass. There is no tenderness.  Musculoskeletal: Normal range of motion.  Lymphadenopathy:    She has no cervical adenopathy.  Neurological: She is alert and oriented to person, place, and time.  Skin: Skin is warm and dry. No rash noted.  Psychiatric: She has a normal mood and affect. Her behavior  is normal.          Assessment & Plan:   Viral  URI.  Will treat symptomatically

## 2013-11-30 NOTE — Patient Instructions (Signed)
Resume Zyrtec  Take 400-600 mg of ibuprofen ( Advil, Motrin) with food every 4 to 6 hours as needed for pain relief or control of fever  Acute bronchitis symptoms for less than 10 days are generally not helped by antibiotics.  Take over-the-counter expectorants and cough medications such as  Mucinex DM.  Call if there is no improvement in 5 to 7 days or if he developed worsening cough, fever, or new symptoms, such as shortness of breath or chest pain.

## 2013-11-30 NOTE — Progress Notes (Signed)
Pre-visit discussion using our clinic review tool. No additional management support is needed unless otherwise documented below in the visit note.  

## 2015-01-24 ENCOUNTER — Other Ambulatory Visit: Payer: Self-pay | Admitting: Dermatology

## 2016-05-30 ENCOUNTER — Telehealth: Payer: Self-pay | Admitting: Family Medicine

## 2016-05-30 NOTE — Telephone Encounter (Signed)
Called and spoke with pt. Pt informed me that she called to let her PCP know of the issue. Also pt stated that she does not want to schedule an appointment at this time she started the Levaquin yesterday and seem to be feeling better and will it allow to take its course. Pt will make and appointment as she sees fit.

## 2016-05-30 NOTE — Telephone Encounter (Signed)
[facility] - Client [place] [facility] Patient Name: Kayla Mccarthy DOB: May 26, 1976 Initial Comment Caller says, went to UC on Sat, sore throat w/ pus pockets, culture came back with strep and another items as well. She wants to verify her treatment. 1st antibiotic did not work, then was switched to an injection and 2 antibiotics. -- she also has a rash, and is concerned her blood was not taked to process. Nurse Assessment Nurse: Dimas Chyle, RN, Dellis Filbert Date/Time Eilene Ghazi Time): 05/30/2016 8:24:37 AM Confirm and document reason for call. If symptomatic, describe symptoms. You must click the next button to save text entered. ---Caller says, went to UC on Sat, sore throat w/ pus pockets, culture came back with strep and another items as well. She wants to verify her treatment. 1st antibiotic did not work, then was switched to an injection and 2 antibiotics. -- she also has a rash, and is concerned her blood was not taken to process. Originally on Augmentin. Had Rocephin shot and now on Levaquin. Has the patient traveled out of the country within the last 30 days? ---No Does the patient have any new or worsening symptoms? ---Yes Will a triage be completed? ---Yes Related visit to physician within the last 2 weeks? ---No Does the PT have any chronic conditions? (i.e. diabetes, asthma, etc.) ---No Is the patient pregnant or possibly pregnant? (Ask all females between the ages of 17-55) ---No Is this a behavioral health or substance abuse call? ---No Guidelines Guideline Title Affirmed Question Affirmed Notes Rash - Widespread On Drugs Taking new prescription antibiotic (EXCEPTION: finished taking new prescription antibiotic) Rash or Redness - Widespread Mild widespread rash Final Disposition User See PCP When Office is Open (within 3 days) Dimas Chyle, RN, Dellis Filbert Comments Advised caller that I would contact office about  possible reaction associated with antibiotics. Spoke with office and they advised that patient was not seen in office for strep and that she would need to follow up with UC where she was seen if she is having possible allergic reaction to antibiotics. Called back and caller also concerned about other rash that she has had for 3 weeks. PLEASE NOTE: All timestamps contained within this report are represented as [place] Standard Time. CONFIDENTIALTY NOTICE: This fax transmission is intended only for the addressee. It contains information that is legally privileged, confidential or otherwise protected from use or disclosure. If you are not the intended recipient, you are strictly prohibited from reviewing, disclosing, copying using or disseminating any of this information or taking any action in reliance on or regarding this information. If you have received this fax in error, please notify us immediately by telephone so that we can arrange for its return to [place]. Phone: [phone], Toll-Free: [phone], Fax: [phone] Page: 2 of 2 Call Id: [id number] Referrals REFERRED TO PCP OFFICE Disagree/Comply: Comply Disagree/Comply: Comply

## 2017-01-22 ENCOUNTER — Ambulatory Visit
Admission: RE | Admit: 2017-01-22 | Discharge: 2017-01-22 | Disposition: A | Discharge status: Home / Self Care | Payer: BLUE CROSS/BLUE SHIELD | Source: Ambulatory Visit | Attending: Gastroenterology | Admitting: Gastroenterology

## 2017-01-22 ENCOUNTER — Other Ambulatory Visit: Payer: Self-pay | Admitting: Gastroenterology

## 2017-01-22 DIAGNOSIS — R1033 Periumbilical pain: Secondary | ICD-10-CM

## 2017-01-22 DIAGNOSIS — R1032 Left lower quadrant pain: Secondary | ICD-10-CM

## 2017-01-22 MED ORDER — IOPAMIDOL (ISOVUE-300) INJECTION 61%
100.0000 mL | Freq: Once | INTRAVENOUS | Status: AC | PRN
  Administered 2017-01-22: 100 mL via INTRAVENOUS

## 2017-07-22 ENCOUNTER — Other Ambulatory Visit: Payer: Self-pay

## 2018-01-15 ENCOUNTER — Encounter: Payer: Self-pay | Admitting: Family Medicine

## 2018-01-15 ENCOUNTER — Ambulatory Visit (INDEPENDENT_AMBULATORY_CARE_PROVIDER_SITE_OTHER): Payer: BLUE CROSS/BLUE SHIELD | Admitting: Family Medicine

## 2018-01-15 ENCOUNTER — Ambulatory Visit: Payer: Self-pay | Admitting: *Deleted

## 2018-01-15 VITALS — BP 118/82 | HR 68 | Temp 98.3°F | Wt 179.0 lb

## 2018-01-15 DIAGNOSIS — R0789 Other chest pain: Secondary | ICD-10-CM | POA: Diagnosis not present

## 2018-01-15 NOTE — Patient Instructions (Signed)

## 2018-01-15 NOTE — Telephone Encounter (Signed)
Pt reports mild, intermittent chest "heaviness" x 1 week. States mid chest to left side at breast.  Denies SOB, dizziness, nausea. Last symptoms 2-3 days ago while exercising. States sensation more evident "when HR up". Also reports episode of abdominal pain Monday, states was "bloated," pain center abdomen, above umbilicus. States pain "bad" for approximately 20 minutes; felt lightheaded at that time, subsided on own with rest. Reports B/P at that time 160/?, checked at work. No further episodes. Last seen by Dr. Sherren Mocha five years ago. NT called office, spoke with Lawana; directed to schedule an acute visit today with provider, then pt could make appt to establish care. Appt made for today with Dr. Volanda Napoleon at [phone]. Care advise given per protocol. Instructed to go to ED if symptoms worsen.    Reason for Disposition . [1] Chest pain lasts > 5 minutes AND [2] occurred > 3 days ago (72 hours) AND [3] NO chest pain or cardiac symptoms now  Answer Assessment - Initial Assessment Questions 1. LOCATION: "Where does it hurt?"       Intermittent , mid chest towards left side at breast, "Heaviness." 2. RADIATION: "Does the pain go anywhere else?" (e.g., into neck, jaw, arms, back)     no 3. ONSET: "When did the chest pain begin?" (Minutes, hours or days)      1 week ago. 4. PATTERN "Does the pain come and go, or has it been constant since it started?"  "Does it get worse with exertion?"      Comes and goes, worse with exertion. 5. DURATION: "How long does it last" (e.g., seconds, minutes, hours)      Several minutes 6. SEVERITY: "How bad is the pain?"  (e.g., Scale 1-10; mild, moderate, or severe)    - MILD (1-3): doesn't interfere with normal activities     - MODERATE (4-7): interferes with normal activities or awakens from sleep    - SEVERE (8-10): excruciating pain, unable to do any normal activities       mild 7. CARDIAC RISK FACTORS: "Do you have any history of heart problems or risk factors for heart  disease?" (e.g., prior heart attack, angina; high blood pressure, diabetes, being overweight, high cholesterol, smoking, or strong family history of heart disease)     no 8. PULMONARY RISK FACTORS: "Do you have any history of lung disease?"  (e.g., blood clots in lung, asthma, emphysema, birth control pills)     no 9. CAUSE: "What do you think is causing the chest pain?"     unsure 10. OTHER SYMPTOMS: "Do you have any other symptoms?" (e.g., dizziness, nausea, vomiting, sweating, fever, difficulty breathing, cough)       Had abdominal pain 2 days ago, mid, above umbilicus with lightheadedness, subsided with rest. 11. PREGNANCY: "Is there any chance you are pregnant?" "When was your last menstrual period?"       Slight chance, not late with menses.  Protocols used: CHEST PAIN-A-AH

## 2018-01-15 NOTE — Telephone Encounter (Signed)
Confirmed patient has arrived here in our office.

## 2018-01-15 NOTE — Telephone Encounter (Signed)
Monitor for office arrival here this afternoon.

## 2018-01-15 NOTE — Progress Notes (Signed)
Subjective:    Patient ID: Kayla Mccarthy, female    DOB: 23-Feb-1976, 42 y.o.   MRN: [id number]  Chief Complaint  Patient presents with  . Chest Pain    HPI Patient was seen today for ongoing concern.  Pt endorses several episodes of stomach discomfort and bloating as well as a feeling of chest heaviness/pain over the last week.  Two days ago pt was at work exercising when she briefly noticed chest pain and diaphoresis.  The chest pain subsided.  Pt did not seek medical attention.   Pt has a difficult time describing the pain/discomfort, but notes it was located in L sternal area.  Pt has a FMHx of MI (father).  Pt states she has had issues off and on with abdominal pain and bloating.  She was seen by GI in the past and started on omeprazole.  Pt takes omeprazole as needed.  Pt also notices an increase in symptoms around her menses.  Pt was seen by OB/GYN today.  Has a h/o ovarian cyst.  Pt endorses increased stress.  She and her husband are trying to buy a house.  Pt at times also cares for her mother.  Past Medical History:  Diagnosis Date  . Allergic rhinitis   . Cholelithiasis   . Miscarriage    x 2  . Urticaria     No Known Allergies  ROS General: Denies fever, chills, night sweats, changes in weight, changes in appetite HEENT: Denies headaches, ear pain, changes in vision, rhinorrhea, sore throat CV: Denies palpitations, SOB, orthopnea  +CP Pulm: Denies SOB, cough, wheezing GI: Denies nausea, vomiting, diarrhea, constipation   +abdominal pain, bloating GU: Denies dysuria, hematuria, frequency, vaginal discharge Msk: Denies muscle cramps, joint pains Neuro: Denies weakness, numbness, tingling Skin: Denies rashes, bruising Psych: Denies depression, anxiety, hallucinations     Objective:    Blood pressure 118/82, pulse 68, temperature 98.3 F (36.8 C), temperature source Oral, weight 179 lb (81.2 kg), SpO2 98 %.   Gen. Pleasant, well-nourished, in no distress, normal  affect   HEENT: [place]/AT, face symmetric, no scleral icterus, PERRLA, nares patent without drainage Lungs: no accessory muscle use, CTAB, no wheezes or rales Cardiovascular: RRR, no m/r/g, no peripheral edema Abdomen: BS present, soft, NT/ND Neuro:  A&Ox3, CN II-XII intact, normal gait   Wt Readings from Last 3 Encounters:  01/15/18 179 lb (81.2 kg)  11/30/13 191 lb (86.6 kg)  03/24/12 175 lb 0.5 oz (79.4 kg)    Lab Results  Component Value Date   WBC 6.6 01/05/2011   HGB 13.7 01/05/2011   HCT 41.5 01/05/2011   PLT 221 01/05/2011   GLUCOSE 79 11/29/2010   CHOL 133 11/29/2010   TRIG 74.0 11/29/2010   HDL 39.20 11/29/2010   LDLCALC 79 11/29/2010   ALT 10 11/29/2010   AST 11 11/29/2010   NA 138 11/29/2010   K 4.8 11/29/2010   CL 105 11/29/2010   CREATININE 0.9 11/29/2010   BUN 15 11/29/2010   CO2 29 11/29/2010   TSH 1.12 11/29/2010    Assessment/Plan:  Chest tightness  -currently asymptomatic -Discussed possible causes including anxiety, GERD, cardiac -EKG obtained this visit and normal- VR 63, no ST elevation or T wave inversion.  No previous studies for comparison. -PHQ 9 score 2 and gad 7 score 5 -Patient encouraged to take omeprazole daily to see if this helps her symptoms. - Plan: EKG 12-Lead follow-up -pt given ED precautions  In the next few weeks with  PCP for CPE and lipid panel.  Grier Mitts, MD

## 2018-01-18 ENCOUNTER — Inpatient Hospital Stay (EMERGENCY_DEPARTMENT_HOSPITAL)
Admission: AD | Admit: 2018-01-18 | Discharge: 2018-01-18 | Disposition: A | Discharge status: Home / Self Care | Payer: BLUE CROSS/BLUE SHIELD | Source: Ambulatory Visit | Attending: Obstetrics & Gynecology | Admitting: Obstetrics & Gynecology

## 2018-01-18 ENCOUNTER — Encounter (HOSPITAL_COMMUNITY): Payer: Self-pay | Admitting: *Deleted

## 2018-01-18 ENCOUNTER — Emergency Department (HOSPITAL_COMMUNITY)
Admission: EM | Admit: 2018-01-18 | Discharge: 2018-01-18 | Disposition: A | Discharge status: Home / Self Care | Payer: BLUE CROSS/BLUE SHIELD | Attending: Emergency Medicine | Admitting: Emergency Medicine

## 2018-01-18 ENCOUNTER — Other Ambulatory Visit: Payer: Self-pay

## 2018-01-18 ENCOUNTER — Emergency Department (HOSPITAL_COMMUNITY): Payer: BLUE CROSS/BLUE SHIELD

## 2018-01-18 DIAGNOSIS — K859 Acute pancreatitis without necrosis or infection, unspecified: Secondary | ICD-10-CM | POA: Insufficient documentation

## 2018-01-18 DIAGNOSIS — R1013 Epigastric pain: Secondary | ICD-10-CM | POA: Diagnosis not present

## 2018-01-18 DIAGNOSIS — Z79899 Other long term (current) drug therapy: Secondary | ICD-10-CM | POA: Diagnosis not present

## 2018-01-18 DIAGNOSIS — R079 Chest pain, unspecified: Secondary | ICD-10-CM | POA: Insufficient documentation

## 2018-01-18 DIAGNOSIS — R109 Unspecified abdominal pain: Secondary | ICD-10-CM | POA: Diagnosis present

## 2018-01-18 DIAGNOSIS — E039 Hypothyroidism, unspecified: Secondary | ICD-10-CM | POA: Insufficient documentation

## 2018-01-18 LAB — COMPREHENSIVE METABOLIC PANEL
ALT: 47 U/L (ref 14–54)
ANION GAP: 11 (ref 5–15)
AST: 90 U/L — ABNORMAL HIGH (ref 15–41)
Albumin: 4.2 g/dL (ref 3.5–5.0)
Alkaline Phosphatase: 47 U/L (ref 38–126)
BUN: 14 mg/dL (ref 6–20)
CHLORIDE: 103 mmol/L (ref 101–111)
CO2: 25 mmol/L (ref 22–32)
Calcium: 9.5 mg/dL (ref 8.9–10.3)
Creatinine, Ser: 1.01 mg/dL — ABNORMAL HIGH (ref 0.44–1.00)
GFR calc non Af Amer: >60 mL/min (ref >60)
Glucose, Bld: 109 mg/dL — ABNORMAL HIGH (ref 65–99)
Potassium: 3.9 mmol/L (ref 3.5–5.1)
Sodium: 139 mmol/L (ref 135–145)
Total Bilirubin: 0.8 mg/dL (ref 0.3–1.2)
Total Protein: 7.3 g/dL (ref 6.5–8.1)

## 2018-01-18 LAB — CBC
HCT: 43.7 % (ref 36.0–46.0)
HEMOGLOBIN: 14.7 g/dL (ref 12.0–15.0)
MCH: 30 pg (ref 26.0–34.0)
MCHC: 33.6 g/dL (ref 30.0–36.0)
MCV: 89.2 fL (ref 78.0–100.0)
Platelets: 292 10*3/uL (ref 150–400)
RBC: 4.9 MIL/uL (ref 3.87–5.11)
RDW: 12.7 % (ref 11.5–15.5)
WBC: 14.7 10*3/uL — ABNORMAL HIGH (ref 4.0–10.5)

## 2018-01-18 LAB — I-STAT BETA HCG BLOOD, ED (MC, WL, AP ONLY): I-stat hCG, quantitative: <5 m[IU]/mL (ref <5)

## 2018-01-18 LAB — LIPASE, BLOOD: LIPASE: 675 U/L — AB (ref 11–51)

## 2018-01-18 MED ORDER — IOPAMIDOL (ISOVUE-300) INJECTION 61%
100.0000 mL | Freq: Once | INTRAVENOUS | Status: AC | PRN
  Administered 2018-01-18: 100 mL via INTRAVENOUS

## 2018-01-18 MED ORDER — ONDANSETRON 4 MG PO TBDP: 4.0000 mg | ORAL_TABLET | Freq: Three times a day (TID) | ORAL | 0 refills | Status: DC | PRN

## 2018-01-18 MED ORDER — HYDROCODONE-ACETAMINOPHEN 5-325 MG PO TABS
2.0000 | ORAL_TABLET | Freq: Once | ORAL | Status: AC
  Administered 2018-01-18: 2 via ORAL
  Filled 2018-01-18: qty 2

## 2018-01-18 MED ORDER — IOPAMIDOL (ISOVUE-300) INJECTION 61%
INTRAVENOUS | Status: AC
  Filled 2018-01-18: qty 100

## 2018-01-18 MED ORDER — GI COCKTAIL ~~LOC~~
30.0000 mL | Freq: Once | ORAL | Status: AC
  Administered 2018-01-18: 30 mL via ORAL
  Filled 2018-01-18: qty 30

## 2018-01-18 MED ORDER — HYDROCODONE-ACETAMINOPHEN 5-325 MG PO TABS: 2.0000 | ORAL_TABLET | ORAL | 0 refills | Status: AC | PRN

## 2018-01-18 MED ORDER — ONDANSETRON 4 MG PO TBDP
4.0000 mg | ORAL_TABLET | Freq: Once | ORAL | Status: AC
  Administered 2018-01-18: 4 mg via ORAL
  Filled 2018-01-18: qty 1

## 2018-01-18 NOTE — Discharge Instructions (Addendum)
See your Physician for recheck in 1 week.  You need a repeat lipase.   Return if symptoms worsen or change.

## 2018-01-18 NOTE — Discharge Instructions (Signed)
Nonspecific Chest Pain °Chest pain can be caused by many different conditions. There is always a chance that your pain could be related to something serious, such as a heart attack or a blood clot in your lungs. Chest pain can also be caused by conditions that are not life-threatening. If you have chest pain, it is very important to follow up with your health care provider. °What are the causes? °Causes of this condition include: °· Heartburn. °· Pneumonia or bronchitis. °· Anxiety or stress. °· Inflammation around your heart (pericarditis) or lung (pleuritis or pleurisy). °· A blood clot in your lung. °· A collapsed lung (pneumothorax). This can develop suddenly on its own (spontaneous pneumothorax) or from trauma to the chest. °· Shingles infection (varicella-zoster virus). °· Heart attack. °· Damage to the bones, muscles, and cartilage that make up your chest wall. This can include: °? Bruised bones due to injury. °? Strained muscles or cartilage due to frequent or repeated coughing or overwork. °? Fracture to one or more ribs. °? Sore cartilage due to inflammation (costochondritis). ° °What increases the risk? °Risk factors for this condition may include: °· Activities that increase your risk for trauma or injury to your chest. °· Respiratory infections or conditions that cause frequent coughing. °· Medical conditions or overeating that can cause heartburn. °· Heart disease or family history of heart disease. °· Conditions or health behaviors that increase your risk of developing a blood clot. °· Having had chicken pox (varicella zoster). ° °What are the signs or symptoms? °Chest pain can feel like: °· Burning or tingling on the surface of your chest or deep in your chest. °· Crushing, pressure, aching, or squeezing pain. °· Dull or sharp pain that is worse when you move, cough, or take a deep breath. °· Pain that is also felt in your back, neck, shoulder, or arm, or pain that spreads to any of these  areas. ° °Your chest pain may come and go, or it may stay constant. °How is this diagnosed? °Lab tests or other studies may be needed to find the cause of your pain. Your health care provider may have you take a test called an ECG (electrocardiogram). An ECG records your heartbeat patterns at the time the test is performed. You may also have other tests, such as: °· Transthoracic echocardiogram (TTE). In this test, sound waves are used to create a picture of the heart structures and to look at how blood flows through your heart. °· Transesophageal echocardiogram (TEE). This is a more advanced imaging test that takes images from inside your body. It allows your health care provider to see your heart in finer detail. °· Cardiac monitoring. This allows your health care provider to monitor your heart rate and rhythm in real time. °· Holter monitor. This is a portable device that records your heartbeat and can help to diagnose abnormal heartbeats. It allows your health care provider to track your heart activity for several days, if needed. °· Stress tests. These can be done through exercise or by taking medicine that makes your heart beat more quickly. °· Blood tests. °· Other imaging tests. ° °How is this treated? °Treatment depends on what is causing your chest pain. Treatment may include: °· Medicines. These may include: °? Acid blockers for heartburn. °? Anti-inflammatory medicine. °? Pain medicine for inflammatory conditions. °? Antibiotic medicine, if an infection is present. °? Medicines to dissolve blood clots. °? Medicines to treat coronary artery disease (CAD). °· Supportive care for conditions that   do not require medicines. This may include: °? Resting. °? Applying heat or cold packs to injured areas. °? Limiting activities until pain decreases. ° °Follow these instructions at home: °Medicines °· If you were prescribed an antibiotic, take it as told by your health care provider. Do not stop taking the  antibiotic even if you start to feel better. °· Take over-the-counter and prescription medicines only as told by your health care provider. °Lifestyle °· Do not use any products that contain nicotine or tobacco, such as cigarettes and e-cigarettes. If you need help quitting, ask your health care provider. °· Do not drink alcohol. °· Make lifestyle changes as directed by your health care provider. These may include: °? Getting regular exercise. Ask your health care provider to suggest some activities that are safe for you. °? Eating a heart-healthy diet. A registered dietitian can help you to learn healthy eating options. °? Maintaining a healthy weight. °? Managing diabetes, if necessary. °? Reducing stress, such as with yoga or relaxation techniques. °General instructions °· Avoid any activities that bring on chest pain. °· If heartburn is the cause for your chest pain, raise (elevate) the head of your bed about 6 inches (15 cm) by putting blocks under the legs. Sleeping with more pillows does not effectively relieve heartburn because it only changes the position of your head. °· Keep all follow-up visits as told by your health care provider. This is important. This includes any further testing if your chest pain does not go away. °Contact a health care provider if: °· Your chest pain does not go away. °· You have a rash with blisters on your chest. °· You have a fever. °· You have chills. °Get help right away if: °· Your chest pain is worse. °· You have a cough that gets worse, or you cough up blood. °· You have severe pain in your abdomen. °· You have severe weakness. °· You faint. °· You have sudden, unexplained chest discomfort. °· You have sudden, unexplained discomfort in your arms, back, neck, or jaw. °· You have shortness of breath at any time. °· You suddenly start to sweat, or your skin gets clammy. °· You feel nauseous or you vomit. °· You suddenly feel light-headed or dizzy. °· Your heart begins to beat  quickly, or it feels like it is skipping beats. °These symptoms may represent a serious problem that is an emergency. Do not wait to see if the symptoms will go away. Get medical help right away. Call your local emergency services (911 in the [place] Do not drive yourself to the hospital. °This information is not intended to replace advice given to you by your health care provider. Make sure you discuss any questions you have with your health care provider. °Document Released: 07/11/2005 Document Revised: 06/25/2016 Document Reviewed: 06/25/2016 °Elsevier Interactive Patient Education © 2017 [employer] ° °

## 2018-01-18 NOTE — ED Provider Notes (Signed)
[facility] EMERGENCY DEPARTMENT Provider Note   CSN: [id number] Arrival date & time: 01/18/18  1914     History   Chief Complaint Chief Complaint  Patient presents with  . Abdominal Pain    HPI Kayla Mccarthy is a 42 y.o. female.  The history is provided by the patient. No language interpreter was used.  Abdominal Pain   This is a new problem. The current episode started more than 1 week ago. The problem occurs constantly. The problem has been gradually worsening. The pain is associated with an unknown factor. The pain is located in the epigastric region. The quality of the pain is aching. The pain is severe. Associated symptoms include anorexia. Nothing aggravates the symptoms. Nothing relieves the symptoms. Past workup does not include GI consult. Her past medical history does not include gallstones.   Pt reports she has seen her primary MD and has been to [facility] for abdominal pain.  Pt reports she had an episode of severe pain in her upper abdomen.   Past Medical History:  Diagnosis Date  . Allergic rhinitis   . Cholelithiasis   . Miscarriage    x 2  . Urticaria     Patient Active Problem List   Diagnosis Date Noted  . Hypothyroidism 12/04/2010  . BREAST MASS, RIGHT 04/12/2008  . UNSPECIFIED URTICARIA 04/12/2008  . CHOLELITHIASIS 03/03/2008  . CONTACT DERMATITIS&OTH ECZEMA DUE OTH [place] AGENT 03/03/2008  . ALLERGIC RHINITIS 08/08/2007    Past Surgical History:  Procedure Laterality Date  . ABLATION     uterine  . CHOLECYSTECTOMY    . DILATION AND CURETTAGE OF UTERUS       OB History    Gravida  7   Para  2   Term  2   Preterm      AB  5   Living  2     SAB  5   TAB      Ectopic      Multiple      Live Births  2            Home Medications    Prior to Admission medications   Medication Sig Start Date End Date Taking? Authorizing Provider  cetirizine (ZYRTEC) 10 MG tablet Take 10 mg by mouth at bedtime.    Yes  [provider]  Cholecalciferol (VITAMIN D3) 1000 UNITS capsule Take 1,000 Units by mouth at bedtime.    Yes [provider]  omeprazole (PRILOSEC) 40 MG capsule Take 40 mg by mouth at bedtime. 12/01/17  Yes [provider]  thyroid (ARMOUR) 60 MG tablet Take 60 mg by mouth daily before breakfast.   Yes [provider]  HYDROcodone-acetaminophen (NORCO/VICODIN) 5-325 MG tablet Take 2 tablets by mouth every 4 (four) hours as needed. 01/18/18   Fransico Meadow, PA-C  ondansetron (ZOFRAN ODT) 4 MG disintegrating tablet Take 1 tablet (4 mg total) by mouth every 8 (eight) hours as needed for nausea or vomiting. 01/18/18   Fransico Meadow, PA-C    Family History Family History  Problem Relation Age of Onset  . Hiatal hernia Mother   . Gallbladder disease Mother   . Obesity Mother   . Crohn's disease Mother   . Hyperlipidemia Mother   . Diabetes Mother   . Irritable bowel syndrome Mother   . Hypertension Mother   . Irritable bowel syndrome Sister   . Diabetes Brother     Social History Social History  Tobacco Use  . Smoking status: Never Smoker  . Smokeless tobacco: Never Used  Substance Use Topics  . Alcohol use: No  . Drug use: No     Allergies   Patient has no known allergies.   Review of Systems Review of Systems  Gastrointestinal: Positive for abdominal pain and anorexia.  All other systems reviewed and are negative.    Physical Exam Updated Vital Signs BP 118/77   Pulse (!) 58   Temp 98.1 F (36.7 C) (Oral)   Resp 15   Ht 5\' 5"  (1.651 m)   Wt 80.7 kg (178 lb)   LMP 01/18/2018   SpO2 95%   BMI 29.62 kg/m   Physical Exam  Constitutional: She is oriented to person, place, and time. She appears well-developed and well-nourished.  HENT:  Head: Normocephalic.  Eyes: Pupils are equal, round, and reactive to light. EOM are normal.  Neck: Normal range of motion.  Cardiovascular: Normal rate and normal heart sounds.    Pulmonary/Chest: Effort normal.  Abdominal: Normal appearance. She exhibits no distension. Bowel sounds are increased.  Musculoskeletal: Normal range of motion.  Neurological: She is alert and oriented to person, place, and time.  Skin: Skin is warm.  Psychiatric: She has a normal mood and affect.  Nursing note and vitals reviewed.    ED Treatments / Results  Labs (all labs ordered are listed, but only abnormal results are displayed) Labs Reviewed  LIPASE, BLOOD - Abnormal; Notable for the following components:      Result Value   Lipase 675 (*)    All other components within normal limits  COMPREHENSIVE METABOLIC PANEL - Abnormal; Notable for the following components:   Glucose, Bld 109 (*)    Creatinine, Ser 1.01 (*)    AST 90 (*)    All other components within normal limits  CBC - Abnormal; Notable for the following components:   WBC 14.7 (*)    All other components within normal limits  I-STAT BETA HCG BLOOD, ED ([facility], [facility], [facility] ONLY)    EKG EKG Interpretation  Date/Time:  Saturday January 18 2018 21:39:47 EDT Ventricular Rate:  70 PR Interval:  140 QRS Duration: 98 QT Interval:  411 QTC Calculation: 444 R Axis:   75 Text Interpretation:  Sinus rhythm Baseline wander in lead(s) V2 no acute st/ts similar to prior 4/19 Confirmed by Aletta Edouard [phone]) on 01/18/2018 9:49:35 PM   Radiology Ct Abdomen Pelvis W Contrast  Result Date: 01/18/2018 CLINICAL DATA:  Epigastric pain for 1 week. Nausea and vomiting. Dizziness. Patient has been seen twice for same symptoms. EXAM: CT ABDOMEN AND PELVIS WITH CONTRAST TECHNIQUE: Multidetector CT imaging of the abdomen and pelvis was performed using the standard protocol following bolus administration of intravenous contrast. CONTRAST:  162mL ISOVUE-300 IOPAMIDOL (ISOVUE-300) INJECTION 61% COMPARISON:  01/22/2017 FINDINGS: Lower chest: The lung bases are clear. Hepatobiliary: No focal liver abnormality is seen. Status post cholecystectomy.  No biliary dilatation. Pancreas: Unremarkable. No pancreatic ductal dilatation or surrounding inflammatory changes. Spleen: Normal in size without focal abnormality. Adrenals/Urinary Tract: Adrenal glands are unremarkable. Kidneys are normal, without renal calculi, focal lesion, or hydronephrosis. Bladder is unremarkable. Stomach/Bowel: Stomach, small bowel, and colon are mostly decompressed. No wall thickening is appreciated although under distention limits evaluation. No inflammatory infiltration is identified. Appendix is normal. Vascular/Lymphatic: No significant vascular findings are present. No enlarged abdominal or pelvic lymph nodes. Reproductive: Uterus and bilateral adnexa are unremarkable. Other: No abdominal wall hernia or abnormality. No abdominopelvic  ascites. Musculoskeletal: No acute or significant osseous findings. IMPRESSION: No acute process demonstrated in the abdomen or pelvis. No evidence of bowel obstruction or inflammation. Electronically Signed   By: Lucienne Capers M.D.   On: 01/18/2018 22:21    Procedures Procedures (including critical care time)  Medications Ordered in ED Medications  iopamidol (ISOVUE-300) 61 % injection (has no administration in time range)  gi cocktail (Maalox,Lidocaine,Donnatal) (30 mLs Oral Given 01/18/18 2138)  iopamidol (ISOVUE-300) 61 % injection 100 mL (100 mLs Intravenous Contrast Given 01/18/18 2146)  HYDROcodone-acetaminophen (NORCO/VICODIN) 5-325 MG per tablet 2 tablet (2 tablets Oral Given 01/18/18 2309)  ondansetron (ZOFRAN-ODT) disintegrating tablet 4 mg (4 mg Oral Given 01/18/18 2310)     Initial Impression / Assessment and Plan / ED Course  I have reviewed the triage vital signs and the nursing notes.  Pertinent labs & imaging results that were available during my care of the patient were reviewed by me and considered in my medical decision making (see chart for details).     MDM  Ct scan is normal.  Lipase is elevated to 695.  Pt  counseled on pancreatiis. Pt advised clear liquid x 12 hours then progress diet.    Final Clinical Impressions(s) / ED Diagnoses   Final diagnoses:  Acute pancreatitis, unspecified complication status, unspecified pancreatitis type    ED Discharge Orders        Ordered    HYDROcodone-acetaminophen (NORCO/VICODIN) 5-325 MG tablet  Every 4 hours PRN     01/18/18 2255    ondansetron (ZOFRAN ODT) 4 MG disintegrating tablet  Every 8 hours PRN     01/18/18 2255    An After Visit Summary was printed and given to the patient.    Sidney Ace 01/18/18 2328    Hayden Rasmussen, MD 01/20/18 1228

## 2018-01-18 NOTE — ED Triage Notes (Signed)
The pt is c/o epigastric  Pain for one week she has been seen x2 for the same .  [facility]  She has had nausea nd vomiting   She no longer has her gb  She has had nausea and dizziness   lmp now

## 2018-01-18 NOTE — MAU Provider Note (Addendum)
History     CSN: [id number]  Arrival date and time: 01/18/18 1752   First Provider Initiated Contact with Patient 01/18/18 1823      Chief Complaint  Patient presents with  . Abdominal Pain  . Chest Pain  . Shortness of Breath   HPI Kayla Mccarthy is a 42 y.o. non pregnant female who presents with epigastric pain. Symptoms started on Monday. Was seen by PCP and gyn on Thursday. Was started on antacid by her PCP. Was scheduled an outpatient pelvic ultrasound by her gyn.  Reports this evening that symptoms have been worse. Symptoms occur sporadically. Have happened a few times during the week but this evening happened 3 times. Episode lasts for a few minutes at a time. Initially starts with upper abdominal fullness/bloating that radiates to her chest causing chest pain and SOB. Describes chest pain as sharp & heavy; rates 4/10.Symptoms are worse when she's sitting up. Resolves without intervention. She reports similar episodes a year ago; at the time she was being seen by a gastroenterologist who did a CT & found nothing wrong but a small ovarian cyst.  Denies cardiac hx or anxiety/depression. Has had her gall bladder removed. Nausea when the symptoms occur but otherwise no n/v/d. Denies fever/chills, lower abdominal pain, or syncope.   Past Medical History:  Diagnosis Date  . Allergic rhinitis   . Cholelithiasis   . Miscarriage    x 2  . Urticaria     Past Surgical History:  Procedure Laterality Date  . ABLATION     uterine  . CHOLECYSTECTOMY    . DILATION AND CURETTAGE OF UTERUS      Family History  Problem Relation Age of Onset  . Hiatal hernia Mother   . Gallbladder disease Mother   . Obesity Mother   . Crohn's disease Mother   . Hyperlipidemia Mother   . Diabetes Mother   . Irritable bowel syndrome Mother   . Hypertension Mother   . Irritable bowel syndrome Sister   . Diabetes Brother     Social History   Tobacco Use  . Smoking status: Never Smoker  .  Smokeless tobacco: Never Used  Substance Use Topics  . Alcohol use: No  . Drug use: No    Allergies: No Known Allergies  Medications Prior to Admission  Medication Sig Dispense Refill Last Dose  . cetirizine (ZYRTEC) 10 MG tablet Take 10 mg by mouth daily.     Taking  . Cholecalciferol (VITAMIN D3) 1000 UNITS capsule Take 1,000 Units by mouth daily.     Taking  . thyroid (ARMOUR) 60 MG tablet Take 60 mg by mouth daily before breakfast.   Taking    Review of Systems  Constitutional: Negative.   Respiratory: Positive for shortness of breath (during chest pain episodes). Negative for wheezing.   Cardiovascular: Positive for chest pain.  Gastrointestinal: Positive for abdominal distention, abdominal pain and nausea (during episodes). Negative for constipation, diarrhea and vomiting.  Genitourinary: Negative.   Neurological: Negative for syncope and headaches.   Physical Exam   Blood pressure 132/77, pulse 70, temperature 98 F (36.7 C), resp. rate 18, last menstrual period 01/18/2018, SpO2 100 %.  Physical Exam  Nursing note and vitals reviewed. Constitutional: She is oriented to person, place, and time. She appears well-developed and well-nourished. No distress.  HENT:  Head: Normocephalic and atraumatic.  Eyes: Conjunctivae are normal. Right eye exhibits no discharge. Left eye exhibits no discharge. No scleral icterus.  Neck: Normal range  of motion.  Cardiovascular: Normal rate, regular rhythm and normal heart sounds.  No murmur heard. Respiratory: Effort normal and breath sounds normal. No respiratory distress. She has no wheezes.  GI: Soft. Bowel sounds are normal. She exhibits no distension. There is tenderness in the epigastric area. There is no rigidity, no rebound and no guarding.  Neurological: She is alert and oriented to person, place, and time.  Skin: Skin is warm and dry. She is not diaphoretic.  Psychiatric: She has a normal mood and affect. Her behavior is normal.  Judgment and thought content normal.    MAU Course  Procedures No results found for this or any previous visit (from the past 24 hour(s)).  MDM EKG NSR, no ST elevation VSS Pt did not have episode during my exam and time with her. Benign exam. C/w Dr. Benjie Karvonen. Per Dr. Benjie Karvonen, symptoms no gynecological in nature. Pt to be discharged home & f/u with her PCP. Keep ultrasound f/u with [facility].   Discussed discharge with patient. Encouraged patient to f/u with [facility] or [facility] if symptoms continue.   Assessment and Plan  A: 1. Epigastric pain   2. Chest pain, unspecified type    P: Discharge home F/u with ED as needed Keep f/u with ob/gyn   Jorje Guild 01/18/2018, 6:23 PM

## 2018-01-19 ENCOUNTER — Encounter (HOSPITAL_COMMUNITY): Payer: Self-pay

## 2018-01-19 ENCOUNTER — Observation Stay (HOSPITAL_COMMUNITY)
Admission: EM | Admit: 2018-01-19 | Discharge: 2018-01-21 | Disposition: A | Discharge status: Home / Self Care | Payer: BLUE CROSS/BLUE SHIELD | Attending: Internal Medicine | Admitting: Internal Medicine

## 2018-01-19 ENCOUNTER — Other Ambulatory Visit: Payer: Self-pay

## 2018-01-19 DIAGNOSIS — Z79899 Other long term (current) drug therapy: Secondary | ICD-10-CM | POA: Insufficient documentation

## 2018-01-19 DIAGNOSIS — R945 Abnormal results of liver function studies: Secondary | ICD-10-CM | POA: Diagnosis not present

## 2018-01-19 DIAGNOSIS — Z9049 Acquired absence of other specified parts of digestive tract: Secondary | ICD-10-CM | POA: Insufficient documentation

## 2018-01-19 DIAGNOSIS — J309 Allergic rhinitis, unspecified: Secondary | ICD-10-CM | POA: Diagnosis not present

## 2018-01-19 DIAGNOSIS — R1013 Epigastric pain: Principal | ICD-10-CM

## 2018-01-19 DIAGNOSIS — Z8379 Family history of other diseases of the digestive system: Secondary | ICD-10-CM | POA: Diagnosis not present

## 2018-01-19 DIAGNOSIS — L309 Dermatitis, unspecified: Secondary | ICD-10-CM | POA: Diagnosis not present

## 2018-01-19 DIAGNOSIS — Z833 Family history of diabetes mellitus: Secondary | ICD-10-CM | POA: Diagnosis not present

## 2018-01-19 DIAGNOSIS — R748 Abnormal levels of other serum enzymes: Secondary | ICD-10-CM | POA: Diagnosis present

## 2018-01-19 DIAGNOSIS — Z8249 Family history of ischemic heart disease and other diseases of the circulatory system: Secondary | ICD-10-CM | POA: Insufficient documentation

## 2018-01-19 DIAGNOSIS — K760 Fatty (change of) liver, not elsewhere classified: Secondary | ICD-10-CM | POA: Insufficient documentation

## 2018-01-19 DIAGNOSIS — E039 Hypothyroidism, unspecified: Secondary | ICD-10-CM | POA: Diagnosis not present

## 2018-01-19 DIAGNOSIS — R7989 Other specified abnormal findings of blood chemistry: Secondary | ICD-10-CM

## 2018-01-19 HISTORY — DX: Family history of other specified conditions: Z84.89

## 2018-01-19 HISTORY — DX: Cardiac murmur, unspecified: R01.1

## 2018-01-19 HISTORY — DX: Hypothyroidism, unspecified: E03.9

## 2018-01-19 LAB — I-STAT BETA HCG BLOOD, ED (MC, WL, AP ONLY)

## 2018-01-19 LAB — CBC
HEMATOCRIT: 41.9 % (ref 36.0–46.0)
HEMOGLOBIN: 13.8 g/dL (ref 12.0–15.0)
MCH: 29.7 pg (ref 26.0–34.0)
MCHC: 32.9 g/dL (ref 30.0–36.0)
MCV: 90.3 fL (ref 78.0–100.0)
Platelets: 269 10*3/uL (ref 150–400)
RBC: 4.64 MIL/uL (ref 3.87–5.11)
RDW: 13.1 % (ref 11.5–15.5)
WBC: 5.2 10*3/uL (ref 4.0–10.5)

## 2018-01-19 LAB — COMPREHENSIVE METABOLIC PANEL
ALBUMIN: 3.8 g/dL (ref 3.5–5.0)
ALT: 348 U/L — ABNORMAL HIGH (ref 14–54)
ANION GAP: 9 (ref 5–15)
AST: 278 U/L — ABNORMAL HIGH (ref 15–41)
Alkaline Phosphatase: 74 U/L (ref 38–126)
BUN: 11 mg/dL (ref 6–20)
CHLORIDE: 105 mmol/L (ref 101–111)
CO2: 23 mmol/L (ref 22–32)
Calcium: 8.7 mg/dL — ABNORMAL LOW (ref 8.9–10.3)
Creatinine, Ser: 0.99 mg/dL (ref 0.44–1.00)
GFR calc Af Amer: >60 mL/min (ref >60)
GFR calc non Af Amer: >60 mL/min (ref >60)
GLUCOSE: 94 mg/dL (ref 65–99)
POTASSIUM: 3.7 mmol/L (ref 3.5–5.1)
Sodium: 137 mmol/L (ref 135–145)
Total Bilirubin: 1.1 mg/dL (ref 0.3–1.2)
Total Protein: 6.9 g/dL (ref 6.5–8.1)

## 2018-01-19 LAB — URINALYSIS, ROUTINE W REFLEX MICROSCOPIC
BACTERIA UA: NONE SEEN
Bilirubin Urine: NEGATIVE
Glucose, UA: NEGATIVE mg/dL
KETONES UR: 80 mg/dL — AB
Leukocytes, UA: NEGATIVE
Nitrite: NEGATIVE
PROTEIN: NEGATIVE mg/dL
Specific Gravity, Urine: 1.033 — ABNORMAL HIGH (ref 1.005–1.030)
pH: 5 (ref 5.0–8.0)

## 2018-01-19 LAB — I-STAT CG4 LACTIC ACID, ED: LACTIC ACID, VENOUS: 0.89 mmol/L (ref 0.5–1.9)

## 2018-01-19 LAB — LIPASE, BLOOD: LIPASE: 28 U/L (ref 11–51)

## 2018-01-19 NOTE — ED Triage Notes (Signed)
Patient was dx with pancreatitis last night (here) and was told to come back if unable to hold anything down or feeling worse.

## 2018-01-20 ENCOUNTER — Observation Stay (HOSPITAL_COMMUNITY): Payer: BLUE CROSS/BLUE SHIELD

## 2018-01-20 ENCOUNTER — Emergency Department (HOSPITAL_COMMUNITY): Payer: BLUE CROSS/BLUE SHIELD

## 2018-01-20 ENCOUNTER — Encounter (HOSPITAL_COMMUNITY): Payer: Self-pay | Admitting: Internal Medicine

## 2018-01-20 DIAGNOSIS — R7989 Other specified abnormal findings of blood chemistry: Secondary | ICD-10-CM | POA: Diagnosis present

## 2018-01-20 DIAGNOSIS — R945 Abnormal results of liver function studies: Secondary | ICD-10-CM | POA: Diagnosis present

## 2018-01-20 DIAGNOSIS — R1013 Epigastric pain: Secondary | ICD-10-CM | POA: Diagnosis not present

## 2018-01-20 DIAGNOSIS — K760 Fatty (change of) liver, not elsewhere classified: Secondary | ICD-10-CM | POA: Diagnosis not present

## 2018-01-20 DIAGNOSIS — E039 Hypothyroidism, unspecified: Secondary | ICD-10-CM | POA: Diagnosis not present

## 2018-01-20 LAB — HEPATIC FUNCTION PANEL
ALBUMIN: 3.4 g/dL — AB (ref 3.5–5.0)
ALK PHOS: 56 U/L (ref 38–126)
ALT: 252 U/L — AB (ref 14–54)
AST: 133 U/L — AB (ref 15–41)
BILIRUBIN TOTAL: 0.9 mg/dL (ref 0.3–1.2)
Bilirubin, Direct: 0.1 mg/dL (ref 0.1–0.5)
Indirect Bilirubin: 0.8 mg/dL (ref 0.3–0.9)
Total Protein: 6 g/dL — ABNORMAL LOW (ref 6.5–8.1)

## 2018-01-20 LAB — I-STAT CG4 LACTIC ACID, ED: Lactic Acid, Venous: 0.95 mmol/L (ref 0.5–1.9)

## 2018-01-20 LAB — PROTIME-INR
INR: 1.08
INR: 1.12
Prothrombin Time: 14 seconds (ref 11.4–15.2)
Prothrombin Time: 14.3 seconds (ref 11.4–15.2)

## 2018-01-20 LAB — GLUCOSE, CAPILLARY
GLUCOSE-CAPILLARY: 73 mg/dL (ref 65–99)
GLUCOSE-CAPILLARY: 83 mg/dL (ref 65–99)
GLUCOSE-CAPILLARY: 94 mg/dL (ref 65–99)

## 2018-01-20 LAB — SALICYLATE LEVEL: Salicylate Lvl: <7 mg/dL (ref 2.8–30.0)

## 2018-01-20 LAB — LIPASE, BLOOD: LIPASE: 25 U/L (ref 11–51)

## 2018-01-20 LAB — ACETAMINOPHEN LEVEL
Acetaminophen (Tylenol), Serum: <10 ug/mL — ABNORMAL LOW (ref 10–30)
Acetaminophen (Tylenol), Serum: <10 ug/mL — ABNORMAL LOW (ref 10–30)

## 2018-01-20 LAB — APTT: APTT: 30 s (ref 24–36)

## 2018-01-20 MED ORDER — TRAMADOL HCL 50 MG PO TABS
50.0000 mg | ORAL_TABLET | Freq: Four times a day (QID) | ORAL | Status: DC | PRN
  Administered 2018-01-20: 50 mg via ORAL
  Filled 2018-01-20: qty 1

## 2018-01-20 MED ORDER — GADOBENATE DIMEGLUMINE 529 MG/ML IV SOLN
15.0000 mL | Freq: Once | INTRAVENOUS | Status: AC
  Administered 2018-01-20: 15 mL via INTRAVENOUS

## 2018-01-20 MED ORDER — HYOSCYAMINE SULFATE 0.5 MG/ML IJ SOLN
0.5000 mg | Freq: Four times a day (QID) | INTRAMUSCULAR | Status: DC | PRN
  Filled 2018-01-20: qty 1

## 2018-01-20 MED ORDER — ACETAMINOPHEN 650 MG RE SUPP: 650.0000 mg | Freq: Four times a day (QID) | RECTAL | Status: DC | PRN

## 2018-01-20 MED ORDER — MORPHINE SULFATE (PF) 4 MG/ML IV SOLN
1.0000 mg | INTRAVENOUS | Status: DC | PRN
  Administered 2018-01-20 (×2): 1 mg via INTRAVENOUS
  Filled 2018-01-20 (×2): qty 1

## 2018-01-20 MED ORDER — ONDANSETRON HCL 4 MG PO TABS: 4.0000 mg | ORAL_TABLET | Freq: Four times a day (QID) | ORAL | Status: DC | PRN

## 2018-01-20 MED ORDER — FAMOTIDINE IN NACL 20-0.9 MG/50ML-% IV SOLN
20.0000 mg | Freq: Once | INTRAVENOUS | Status: AC
  Administered 2018-01-20: 20 mg via INTRAVENOUS
  Filled 2018-01-20: qty 50

## 2018-01-20 MED ORDER — SODIUM CHLORIDE 0.9 % IV BOLUS
1000.0000 mL | Freq: Once | INTRAVENOUS | Status: AC
  Administered 2018-01-20: 1000 mL via INTRAVENOUS

## 2018-01-20 MED ORDER — ONDANSETRON HCL 4 MG/2ML IJ SOLN
4.0000 mg | Freq: Four times a day (QID) | INTRAMUSCULAR | Status: DC | PRN
  Administered 2018-01-20: 4 mg via INTRAVENOUS
  Filled 2018-01-20: qty 2

## 2018-01-20 MED ORDER — ACETAMINOPHEN 325 MG PO TABS
650.0000 mg | ORAL_TABLET | Freq: Four times a day (QID) | ORAL | Status: DC | PRN
  Filled 2018-01-20: qty 2

## 2018-01-20 MED ORDER — HYOSCYAMINE SULFATE 0.5 MG/ML IJ SOLN: 0.5000 mg | Freq: Four times a day (QID) | INTRAMUSCULAR | Status: DC | PRN

## 2018-01-20 MED ORDER — PANTOPRAZOLE SODIUM 40 MG IV SOLR
40.0000 mg | Freq: Two times a day (BID) | INTRAVENOUS | Status: DC
  Administered 2018-01-20 – 2018-01-21 (×3): 40 mg via INTRAVENOUS
  Filled 2018-01-20 (×3): qty 40

## 2018-01-20 MED ORDER — GI COCKTAIL ~~LOC~~
30.0000 mL | Freq: Once | ORAL | Status: AC
  Administered 2018-01-20: 30 mL via ORAL
  Filled 2018-01-20: qty 30

## 2018-01-20 MED ORDER — ONDANSETRON HCL 4 MG/2ML IJ SOLN: 4.0000 mg | Freq: Four times a day (QID) | INTRAMUSCULAR | Status: DC | PRN

## 2018-01-20 MED ORDER — LACTATED RINGERS IV SOLN
INTRAVENOUS | Status: AC
  Administered 2018-01-20 (×2): via INTRAVENOUS

## 2018-01-20 MED ORDER — ONDANSETRON HCL 4 MG/2ML IJ SOLN
4.0000 mg | Freq: Once | INTRAMUSCULAR | Status: AC
  Administered 2018-01-20: 4 mg via INTRAVENOUS
  Filled 2018-01-20: qty 2

## 2018-01-20 NOTE — H&P (Signed)
History and Physical    Kayla Mccarthy JJK:[id number] DOB: 09/26/1976 DOA: 01/19/2018  PCP: Dorena Cookey, MD  Patient coming from: Home.  Chief Complaint: Abdominal pain.  HPI: Kayla Mccarthy is a 42 y.o. female with history of hypothyroidism and previous cholecystectomy who had presented to the ER yesterday with complaint of abdominal pain and nausea vomiting.  At that time patient's labs showed lipase of 675 with AST of 90.  CT scan of the abdomen and pelvis was unremarkable.  Patient was discharged home.  Since going home patient has been a persistent abdominal discomfort mostly in the epigastric area with nausea and weakness.  Felt dizzy.  Patient came back to the ER.  Patient's pain increased on eating.  Patient was discharged home on hydrocodone yesterday.  Patient states she initially took 2 tablets and then later on she only took 1 tablet every 4 hours and not more than recommended.  ED Course: In the ER patient's repeat labs shows market elevation in AST ALT of 278/348.  Sonogram of the abdomen done does not show any CBD dilation.  Tylenol level and salicylate levels were negative.  Acute hepatitis panel is pending.  Patient is afebrile.  Patient is being admitted for further management of abdominal pain with elevated LFTs.  Review of Systems: As per HPI, rest all negative.   Past Medical History:  Diagnosis Date  . Allergic rhinitis   . Cholelithiasis   . Miscarriage    x 2  . Urticaria     Past Surgical History:  Procedure Laterality Date  . ABLATION     uterine  . CHOLECYSTECTOMY    . DILATION AND CURETTAGE OF UTERUS       reports that she has never smoked. She has never used smokeless tobacco. She reports that she does not drink alcohol or use drugs.  No Known Allergies  Family History  Problem Relation Age of Onset  . Hiatal hernia Mother   . Gallbladder disease Mother   . Obesity Mother   . Crohn's disease Mother   . Hyperlipidemia Mother   . Diabetes  Mother   . Irritable bowel syndrome Mother   . Hypertension Mother   . Irritable bowel syndrome Sister   . Diabetes Brother     Prior to Admission medications   Medication Sig Start Date End Date Taking? Authorizing Provider  cetirizine (ZYRTEC) 10 MG tablet Take 10 mg by mouth at bedtime.    Yes [provider]  Cholecalciferol (VITAMIN D3) 1000 UNITS capsule Take 1,000 Units by mouth at bedtime.    Yes [provider]  HYDROcodone-acetaminophen (NORCO/VICODIN) 5-325 MG tablet Take 2 tablets by mouth every 4 (four) hours as needed. 01/18/18  Yes Caryl Ada K, PA-C  omeprazole (PRILOSEC) 40 MG capsule Take 40 mg by mouth at bedtime. 12/01/17  Yes [provider]  ondansetron (ZOFRAN ODT) 4 MG disintegrating tablet Take 1 tablet (4 mg total) by mouth every 8 (eight) hours as needed for nausea or vomiting. 01/18/18  Yes Fransico Meadow, PA-C  thyroid (ARMOUR) 60 MG tablet Take 60 mg by mouth daily before breakfast.   Yes [provider]    Physical Exam: Vitals:   01/20/18 0345 01/20/18 0400 01/20/18 0415 01/20/18 0501  BP: 112/70 113/75 101/66 140/76  Pulse: (!) 59 (!) 55 62 (!) 55  Resp:    17  Temp:    98.4 F (36.9 C)  TempSrc:    Oral  SpO2:  99% 98% 96% 100%  Weight:      Height:    5\' 5"  (1.651 m)      Constitutional: Moderately built and nourished. Vitals:   01/20/18 0345 01/20/18 0400 01/20/18 0415 01/20/18 0501  BP: 112/70 113/75 101/66 140/76  Pulse: (!) 59 (!) 55 62 (!) 55  Resp:    17  Temp:    98.4 F (36.9 C)  TempSrc:    Oral  SpO2: 99% 98% 96% 100%  Weight:      Height:    5\' 5"  (1.651 m)   Eyes: Anicteric no pallor. ENMT: No discharge from the ears eyes nose or mouth. Neck: No mass felt.  No neck rigidity.  No JVD appreciated. Respiratory: No rhonchi or crepitations. Cardiovascular: S1-S2 heard no murmurs appreciated. Abdomen: Soft nontender bowel sounds present. Musculoskeletal: No edema.  No joint effusion. Skin:  No rash.  Skin appears warm. Neurologic: Alert awake oriented to time place and person.  Moves all extremities. Psychiatric: Appears normal.  Normal affect.   Labs on Admission: I have personally reviewed following labs and imaging studies  CBC: Recent Labs  Lab 01/18/18 1944 01/19/18 2151  WBC 14.7* 5.2  HGB 14.7 13.8  HCT 43.7 41.9  MCV 89.2 90.3  PLT 292 614   Basic Metabolic Panel: Recent Labs  Lab 01/18/18 1944 01/19/18 2151  NA 139 137  K 3.9 3.7  CL 103 105  CO2 25 23  GLUCOSE 109* 94  BUN 14 11  CREATININE 1.01* 0.99  CALCIUM 9.5 8.7*   GFR: Estimated Creatinine Clearance: 76.9 mL/min (by C-G formula based on SCr of 0.99 mg/dL). Liver Function Tests: Recent Labs  Lab 01/18/18 1944 01/19/18 2151  AST 90* 278*  ALT 47 348*  ALKPHOS 47 74  BILITOT 0.8 1.1  PROT 7.3 6.9  ALBUMIN 4.2 3.8   Recent Labs  Lab 01/18/18 1944 01/19/18 2151  LIPASE 675* 28   No results for input(s): AMMONIA in the last 168 hours. Coagulation Profile: Recent Labs  Lab 01/20/18 0259  INR 1.08   Cardiac Enzymes: No results for input(s): CKTOTAL, CKMB, CKMBINDEX, TROPONINI in the last 168 hours. BNP (last 3 results) No results for input(s): PROBNP in the last 8760 hours. HbA1C: No results for input(s): HGBA1C in the last 72 hours. CBG: No results for input(s): GLUCAP in the last 168 hours. Lipid Profile: No results for input(s): CHOL, HDL, LDLCALC, TRIG, CHOLHDL, LDLDIRECT in the last 72 hours. Thyroid Function Tests: No results for input(s): TSH, T4TOTAL, FREET4, T3FREE, THYROIDAB in the last 72 hours. Anemia Panel: No results for input(s): VITAMINB12, FOLATE, FERRITIN, TIBC, IRON, RETICCTPCT in the last 72 hours. Urine analysis:    Component Value Date/Time   COLORURINE YELLOW 01/19/2018 2143   APPEARANCEUR CLEAR 01/19/2018 2143   LABSPEC 1.033 (H) 01/19/2018 2143   PHURINE 5.0 01/19/2018 2143   GLUCOSEU NEGATIVE 01/19/2018 2143   GLUCOSEU NEGATIVE 11/29/2010  0806   HGBUR SMALL (A) 01/19/2018 2143   HGBUR negative 03/15/2008 [place] NEGATIVE 01/19/2018 2143   KETONESUR 80 (A) 01/19/2018 2143   PROTEINUR NEGATIVE 01/19/2018 2143   UROBILINOGEN 0.2 11/29/2010 0806   NITRITE NEGATIVE 01/19/2018 2143   LEUKOCYTESUR NEGATIVE 01/19/2018 2143   Sepsis Labs: @LABRCNTIP (procalcitonin:4,lacticidven:4) )No results found for this or any previous visit (from the past 240 hour(s)).   Radiological Exams on Admission: US Abdomen Complete  Result Date: 01/20/2018 CLINICAL DATA:  Unspecified disorder of liver function. Elevated liver enzymes today. EXAM: ABDOMEN  ULTRASOUND COMPLETE COMPARISON:  CT 01/18/2018 FINDINGS: Gallbladder: Surgically absent Common bile duct: Diameter: Top normal at 6 mm. No choledocholithiasis. Liver: No focal lesion identified. Within normal limits in parenchymal echogenicity. Portal vein is patent on color Doppler imaging with normal direction of blood flow towards the liver. IVC: No abnormality visualized. Pancreas: Visualized portion unremarkable. Spleen: Size and appearance within normal limits. Right Kidney: Length: 10.2 cm. Echogenicity within normal limits. No mass or hydronephrosis visualized. Left Kidney: Length: 10.6 cm. Echogenicity within normal limits. No mass or hydronephrosis visualized. Abdominal aorta: No aneurysm visualized. Other findings: Physiologic distention of the urinary bladder with bilateral ureteral jets demonstrated suggesting patency of both ureters. IMPRESSION: No sonographic findings for the patient's elevated liver function tests. Status post cholecystectomy without biliary dilatation. No choledocholithiasis. Electronically Signed   By: Ashley Royalty M.D.   On: 01/20/2018 02:48   Ct Abdomen Pelvis W Contrast  Result Date: 01/18/2018 CLINICAL DATA:  Epigastric pain for 1 week. Nausea and vomiting. Dizziness. Patient has been seen twice for same symptoms. EXAM: CT ABDOMEN AND PELVIS WITH CONTRAST  TECHNIQUE: Multidetector CT imaging of the abdomen and pelvis was performed using the standard protocol following bolus administration of intravenous contrast. CONTRAST:  183mL ISOVUE-300 IOPAMIDOL (ISOVUE-300) INJECTION 61% COMPARISON:  01/22/2017 FINDINGS: Lower chest: The lung bases are clear. Hepatobiliary: No focal liver abnormality is seen. Status post cholecystectomy. No biliary dilatation. Pancreas: Unremarkable. No pancreatic ductal dilatation or surrounding inflammatory changes. Spleen: Normal in size without focal abnormality. Adrenals/Urinary Tract: Adrenal glands are unremarkable. Kidneys are normal, without renal calculi, focal lesion, or hydronephrosis. Bladder is unremarkable. Stomach/Bowel: Stomach, small bowel, and colon are mostly decompressed. No wall thickening is appreciated although under distention limits evaluation. No inflammatory infiltration is identified. Appendix is normal. Vascular/Lymphatic: No significant vascular findings are present. No enlarged abdominal or pelvic lymph nodes. Reproductive: Uterus and bilateral adnexa are unremarkable. Other: No abdominal wall hernia or abnormality. No abdominopelvic ascites. Musculoskeletal: No acute or significant osseous findings. IMPRESSION: No acute process demonstrated in the abdomen or pelvis. No evidence of bowel obstruction or inflammation. Electronically Signed   By: Lucienne Capers M.D.   On: 01/18/2018 22:21     Assessment/Plan Principal Problem:   Epigastric pain Active Problems:   Hypothyroidism   Abnormal LFTs    1. Epigastric pain with elevated LFTs -bili concerning for any obstructing stone.  MRCP with and without contrast has been ordered.  Patient is known to Dr. Collene Mares gastroenterologist.  May consult gastroenterology in a.m.  Patient is kept n.p.o. on pain relief medications.  I have ordered repeat labs including LFTs lipase INR Tylenol levels again.  IV fluids.  Pain relief medications.  Patient is afebrile at this  time. 2. Hypothyroidism on [place] Thyroid which cannot be dosed IV.   DVT prophylaxis: SCDs. Code Status: Full code. Family Communication: Discussed with patient. Disposition Plan: Home. Consults called: None. Admission status: Observation.   Rise Patience MD [facility] Pager [phone].  If 7PM-7AM, please contact night-coverage [email or web address] Password [facility]  01/20/2018, 5:16 AM

## 2018-01-20 NOTE — ED Notes (Signed)
ED Provider at bedside. 

## 2018-01-20 NOTE — ED Provider Notes (Signed)
[name] EMERGENCY DEPARTMENT Provider Note   CSN: [id number] Arrival date & time: 01/19/18  2134     History   Chief Complaint Chief Complaint  Patient presents with  . Abdominal Pain    HPI Kayla Mccarthy is a 42 y.o. female.  HPI 42 year old female past medical history significant for cholecystectomy presents to the emergency department today with complaints of ongoing upper abdominal pain, nausea and vomiting.  Patient states her symptoms started 1 week ago and progressively worsened.  Patient was seen in the ER yesterday and diagnosed with pancreatitis.  Patient states that her pain is not controlled with the pain medicine at home and she continues to vomit over the Zofran.  Patient states that she cannot keep anything down by mouth.  Patient states that her pain is worse.  Pain is worse with palpation and movement.  Nothing makes her symptoms better.  Patient states that she did have a cholecystectomy several years ago.  She is followed by GI was saw them last year for acid reflux.  Patient takes Prilosec.  Denies any change in her bowel habits, bloody stools, fevers.  Denies any urinary symptoms. Past Medical History:  Diagnosis Date  . Allergic rhinitis   . Cholelithiasis   . Miscarriage    x 2  . Urticaria     Patient Active Problem List   Diagnosis Date Noted  . Hypothyroidism 12/04/2010  . BREAST MASS, RIGHT 04/12/2008  . UNSPECIFIED URTICARIA 04/12/2008  . CHOLELITHIASIS 03/03/2008  . CONTACT DERMATITIS&OTH ECZEMA DUE OTH [place] AGENT 03/03/2008  . ALLERGIC RHINITIS 08/08/2007    Past Surgical History:  Procedure Laterality Date  . ABLATION     uterine  . CHOLECYSTECTOMY    . DILATION AND CURETTAGE OF UTERUS       OB History    Gravida  7   Para  2   Term  2   Preterm      AB  5   Living  2     SAB  5   TAB      Ectopic      Multiple      Live Births  2            Home Medications    Prior to Admission  medications   Medication Sig Start Date End Date Taking? Authorizing Provider  cetirizine (ZYRTEC) 10 MG tablet Take 10 mg by mouth at bedtime.    Yes [provider]  Cholecalciferol (VITAMIN D3) 1000 UNITS capsule Take 1,000 Units by mouth at bedtime.    Yes [provider]  HYDROcodone-acetaminophen (NORCO/VICODIN) 5-325 MG tablet Take 2 tablets by mouth every 4 (four) hours as needed. 01/18/18  Yes Caryl Ada K, PA-C  omeprazole (PRILOSEC) 40 MG capsule Take 40 mg by mouth at bedtime. 12/01/17  Yes [provider]  ondansetron (ZOFRAN ODT) 4 MG disintegrating tablet Take 1 tablet (4 mg total) by mouth every 8 (eight) hours as needed for nausea or vomiting. 01/18/18  Yes Fransico Meadow, PA-C  thyroid (ARMOUR) 60 MG tablet Take 60 mg by mouth daily before breakfast.   Yes [provider]    Family History Family History  Problem Relation Age of Onset  . Hiatal hernia Mother   . Gallbladder disease Mother   . Obesity Mother   . Crohn's disease Mother   . Hyperlipidemia Mother   . Diabetes Mother   . Irritable bowel syndrome Mother   .  Hypertension Mother   . Irritable bowel syndrome Sister   . Diabetes Brother     Social History Social History   Tobacco Use  . Smoking status: Never Smoker  . Smokeless tobacco: Never Used  Substance Use Topics  . Alcohol use: No  . Drug use: No     Allergies   Patient has no known allergies.   Review of Systems Review of Systems  All other systems reviewed and are negative.    Physical Exam Updated Vital Signs BP 117/75   Pulse (!) 52   Temp 98 F (36.7 C)   Resp 19   Ht 5\' 5"  (1.651 m)   Wt 80.7 kg (178 lb)   LMP 01/19/2018   SpO2 99%   BMI 29.62 kg/m   Physical Exam  Constitutional: She is oriented to person, place, and time. She appears well-developed and well-nourished.  Non-toxic appearance. No distress.  HENT:  Head: Normocephalic and atraumatic.  Nose: Nose normal.    Mouth/Throat: Oropharynx is clear and moist.  Eyes: Pupils are equal, round, and reactive to light. Conjunctivae are normal. Right eye exhibits no discharge. Left eye exhibits no discharge. No scleral icterus.  Neck: Normal range of motion. Neck supple.  Cardiovascular: Normal rate, regular rhythm, normal heart sounds and intact distal pulses.  Pulmonary/Chest: Effort normal and breath sounds normal. No respiratory distress. She exhibits no tenderness.  Abdominal: Soft. Bowel sounds are increased. There is generalized tenderness and tenderness in the epigastric area. There is no rigidity, no rebound, no guarding, no CVA tenderness, no tenderness at McBurney's point and negative Murphy's sign.  Musculoskeletal: Normal range of motion. She exhibits no tenderness.  Lymphadenopathy:    She has no cervical adenopathy.  Neurological: She is alert and oriented to person, place, and time.  Skin: Skin is warm and dry. Capillary refill takes less than 2 seconds.  Psychiatric: Her behavior is normal. Judgment and thought content normal.  Nursing note and vitals reviewed.    ED Treatments / Results  Labs (all labs ordered are listed, but only abnormal results are displayed) Labs Reviewed  COMPREHENSIVE METABOLIC PANEL - Abnormal; Notable for the following components:      Result Value   Calcium 8.7 (*)    AST 278 (*)    ALT 348 (*)    All other components within normal limits  URINALYSIS, ROUTINE W REFLEX MICROSCOPIC - Abnormal; Notable for the following components:   Specific Gravity, Urine 1.033 (*)    Hgb urine dipstick SMALL (*)    Ketones, ur 80 (*)    Squamous Epithelial / LPF 0-5 (*)    All other components within normal limits  LIPASE, BLOOD  CBC  HEPATITIS PANEL, ACUTE  SALICYLATE LEVEL  ACETAMINOPHEN LEVEL  APTT  PROTIME-INR  I-STAT BETA HCG BLOOD, ED ([facility], [facility], [facility] ONLY)  I-STAT CG4 LACTIC ACID, ED  I-STAT CG4 LACTIC ACID, ED    EKG None  Radiology US Abdomen  Complete  Result Date: 01/20/2018 CLINICAL DATA:  Unspecified disorder of liver function. Elevated liver enzymes today. EXAM: ABDOMEN ULTRASOUND COMPLETE COMPARISON:  CT 01/18/2018 FINDINGS: Gallbladder: Surgically absent Common bile duct: Diameter: Top normal at 6 mm. No choledocholithiasis. Liver: No focal lesion identified. Within normal limits in parenchymal echogenicity. Portal vein is patent on color Doppler imaging with normal direction of blood flow towards the liver. IVC: No abnormality visualized. Pancreas: Visualized portion unremarkable. Spleen: Size and appearance within normal limits. Right Kidney: Length: 10.2 cm. Echogenicity within normal  limits. No mass or hydronephrosis visualized. Left Kidney: Length: 10.6 cm. Echogenicity within normal limits. No mass or hydronephrosis visualized. Abdominal aorta: No aneurysm visualized. Other findings: Physiologic distention of the urinary bladder with bilateral ureteral jets demonstrated suggesting patency of both ureters. IMPRESSION: No sonographic findings for the patient's elevated liver function tests. Status post cholecystectomy without biliary dilatation. No choledocholithiasis. Electronically Signed   By: Ashley Royalty M.D.   On: 01/20/2018 02:48   Ct Abdomen Pelvis W Contrast  Result Date: 01/18/2018 CLINICAL DATA:  Epigastric pain for 1 week. Nausea and vomiting. Dizziness. Patient has been seen twice for same symptoms. EXAM: CT ABDOMEN AND PELVIS WITH CONTRAST TECHNIQUE: Multidetector CT imaging of the abdomen and pelvis was performed using the standard protocol following bolus administration of intravenous contrast. CONTRAST:  134mL ISOVUE-300 IOPAMIDOL (ISOVUE-300) INJECTION 61% COMPARISON:  01/22/2017 FINDINGS: Lower chest: The lung bases are clear. Hepatobiliary: No focal liver abnormality is seen. Status post cholecystectomy. No biliary dilatation. Pancreas: Unremarkable. No pancreatic ductal dilatation or surrounding inflammatory changes.  Spleen: Normal in size without focal abnormality. Adrenals/Urinary Tract: Adrenal glands are unremarkable. Kidneys are normal, without renal calculi, focal lesion, or hydronephrosis. Bladder is unremarkable. Stomach/Bowel: Stomach, small bowel, and colon are mostly decompressed. No wall thickening is appreciated although under distention limits evaluation. No inflammatory infiltration is identified. Appendix is normal. Vascular/Lymphatic: No significant vascular findings are present. No enlarged abdominal or pelvic lymph nodes. Reproductive: Uterus and bilateral adnexa are unremarkable. Other: No abdominal wall hernia or abnormality. No abdominopelvic ascites. Musculoskeletal: No acute or significant osseous findings. IMPRESSION: No acute process demonstrated in the abdomen or pelvis. No evidence of bowel obstruction or inflammation. Electronically Signed   By: Lucienne Capers M.D.   On: 01/18/2018 22:21    Procedures Procedures (including critical care time)  Medications Ordered in ED Medications  sodium chloride 0.9 % bolus 1,000 mL (1,000 mLs Intravenous New Bag/Given 01/20/18 0303)  ondansetron (ZOFRAN) injection 4 mg (4 mg Intravenous Given 01/20/18 0304)  gi cocktail (Maalox,Lidocaine,Donnatal) (30 mLs Oral Given 01/20/18 0200)  famotidine (PEPCID) IVPB 20 mg premix (0 mg Intravenous Stopped 01/20/18 0340)     Initial Impression / Assessment and Plan / ED Course  I have reviewed the triage vital signs and the nursing notes.  Pertinent labs & imaging results that were available during my care of the patient were reviewed by me and considered in my medical decision making (see chart for details).     She presents to the emergency department today with complaints of ongoing epigastric abdominal pain with associated nausea and emesis.  Patient was diagnosed with pancreatitis yesterday in the ER and unable to keep p.o. fluids down.  Patient continues to have epigastric discomfort to palpation.   Bowel sounds are increased.  No scleral icterus noted.  Heart regular rate and rhythm with clear lung sounds.  Patient's is afebrile.  No tachycardia or hypotension noted.  Patient has no leukocytosis.  Lipase is 25 and normal despite the 625 she had 2 days ago.  However patient's liver enzymes are significantly elevated at 300 and they were normal 24 hours ago.  Hemoglobin normal.  Elect lites are reassuring.  Normal kidney function.  UA shows no signs of infection.  Bilirubin is normal.  Patient's hepatitis panel, acetaminophen level and salicylate level are pending.  PT/INR pending at this time as well.  We will consult hospital medicine for ongoing pain and vomiting associated with pancreatitis.  However am concerned for patient's elevated liver  enzymes of the past 24 hours.  Ultrasound returned that shows no acute findings of dilated bowel duct.  Possibly hepatitis.  I have low suspicion for acetaminophen overdose at this time however this is pending.  Dr. Hal Hope who agrees to admission will see patient in the ED and place admission orders.  She remains hemodynamic stable and and updated on plan of care.  Final Clinical Impressions(s) / ED Diagnoses   Final diagnoses:  Epigastric pain  Elevated liver enzymes    ED Discharge Orders    None       Aaron Edelman 01/20/18 0350    Palumbo, April, MD 01/20/18 0405

## 2018-01-20 NOTE — Progress Notes (Signed)
Received patient from ED, AOx4, ambulatory, VS stable with slightly elevated BP at 140/76 d/t abd pain at 5/10 with nausea.  Text paged on-call [place] for this admission and further orders.  Will monitor and await for orders.  Pt. Oriented to room, bed controls and call light.

## 2018-01-20 NOTE — Progress Notes (Signed)
Patient admitted after midnight, please see H&P.  Had 1 shot of alcohol before this happened last Monday.  Await MRCP.  Clear diet for now, trend LFTs.  Kayla Bear DO

## 2018-01-20 NOTE — ED Notes (Signed)
Patient transported to Ultrasound 

## 2018-01-20 NOTE — Progress Notes (Signed)
Administered PRN meds Zofran 4mg  IV and Morphine 1 mg Inj per order and noted effective.

## 2018-01-20 NOTE — ED Notes (Addendum)
Admitting Provider at bedside. 

## 2018-01-21 DIAGNOSIS — K760 Fatty (change of) liver, not elsewhere classified: Secondary | ICD-10-CM

## 2018-01-21 DIAGNOSIS — R945 Abnormal results of liver function studies: Secondary | ICD-10-CM | POA: Diagnosis not present

## 2018-01-21 DIAGNOSIS — E039 Hypothyroidism, unspecified: Secondary | ICD-10-CM

## 2018-01-21 LAB — COMPREHENSIVE METABOLIC PANEL
ALK PHOS: 48 U/L (ref 38–126)
ALT: 152 U/L — AB (ref 14–54)
ANION GAP: 8 (ref 5–15)
AST: 49 U/L — ABNORMAL HIGH (ref 15–41)
Albumin: 3.1 g/dL — ABNORMAL LOW (ref 3.5–5.0)
BILIRUBIN TOTAL: 0.7 mg/dL (ref 0.3–1.2)
BUN: 8 mg/dL (ref 6–20)
CALCIUM: 8.6 mg/dL — AB (ref 8.9–10.3)
CO2: 25 mmol/L (ref 22–32)
CREATININE: 0.93 mg/dL (ref 0.44–1.00)
Chloride: 104 mmol/L (ref 101–111)
Glucose, Bld: 86 mg/dL (ref 65–99)
Potassium: 3.6 mmol/L (ref 3.5–5.1)
SODIUM: 137 mmol/L (ref 135–145)
TOTAL PROTEIN: 5.4 g/dL — AB (ref 6.5–8.1)

## 2018-01-21 LAB — CBC
HEMATOCRIT: 36.9 % (ref 36.0–46.0)
HEMOGLOBIN: 12 g/dL (ref 12.0–15.0)
MCH: 29.4 pg (ref 26.0–34.0)
MCHC: 32.5 g/dL (ref 30.0–36.0)
MCV: 90.4 fL (ref 78.0–100.0)
Platelets: 220 10*3/uL (ref 150–400)
RBC: 4.08 MIL/uL (ref 3.87–5.11)
RDW: 12.9 % (ref 11.5–15.5)
WBC: 5.2 10*3/uL (ref 4.0–10.5)

## 2018-01-21 LAB — HEPATITIS PANEL, ACUTE
HEP A IGM: NEGATIVE
HEP B C IGM: NEGATIVE
HEP B S AG: NEGATIVE

## 2018-01-21 LAB — GLUCOSE, CAPILLARY
GLUCOSE-CAPILLARY: 74 mg/dL (ref 65–99)
GLUCOSE-CAPILLARY: 80 mg/dL (ref 65–99)

## 2018-01-21 LAB — HIV ANTIBODY (ROUTINE TESTING W REFLEX): HIV Screen 4th Generation wRfx: NONREACTIVE

## 2018-01-21 MED ORDER — ONDANSETRON 4 MG PO TBDP: 4.0000 mg | ORAL_TABLET | Freq: Three times a day (TID) | ORAL | 0 refills | Status: AC | PRN

## 2018-01-21 NOTE — Progress Notes (Signed)
Patient discharged to home. Verbalizes understanding of all discharge instructions including follow up MD visits. Patient accompanied by spouse.

## 2018-01-21 NOTE — Discharge Summary (Signed)
Physician Discharge Summary  [name] DQQ:[id number] DOB: 12/12/1975 DOA: 01/19/2018  PCP: Dorena Cookey, MD  Admit date: 01/19/2018 Discharge date: 01/21/2018   Recommendations for Outpatient Follow-Up:   Outpatient GI follow up for fatty liver -hand out given regarding diet/exercise/meds to avoid  Discharge Diagnosis:   Principal Problem:   Epigastric pain Active Problems:   Hypothyroidism   Abnormal LFTs   Discharge disposition:  Home.   Discharge Condition: Improved.  Diet recommendation: low fat  Wound care: None.   History of Present Illness:   Kayla Mccarthy is a 42 y.o. female with history of hypothyroidism and previous cholecystectomy who had presented to the ER yesterday with complaint of abdominal pain and nausea vomiting.  At that time patient's labs showed lipase of 675 with AST of 90.  CT scan of the abdomen and pelvis was unremarkable.  Patient was discharged home.  Since going home patient has been a persistent abdominal discomfort mostly in the epigastric area with nausea and weakness.  Felt dizzy.  Patient came back to the ER.  Patient's pain increased on eating.  Patient was discharged home on hydrocodone yesterday.  Patient states she initially took 2 tablets and then later on she only took 1 tablet every 4 hours and not more than recommended.     Hospital Course by Problem:   RUQ pain with elevated LFTs -bili concerning for any obstructing stone.   -MRCP  1. Mild diffuse hepatic steatosis.  No liver masses. 2. No acute abnormality. Bile ducts are within normal post cholecystectomy limits. No choledocholithiasis -suspect if patient had stone, that it passed -tolerating diet -if reoccurs would need GI and ERCP perhaps  Fatty liver -outpatient GI follow up -avoid alcohol etc  Hypothyroidism -resume home meds       Medical Consultants:    None.   Discharge Exam:   Vitals:   01/20/18 2138 01/21/18 0559  BP: 118/71 108/74    Pulse: (!) 51 (!) 51  Resp: 17 18  Temp: 98.1 F (36.7 C) 98.2 F (36.8 C)  SpO2: 99% 98%   Vitals:   01/20/18 0501 01/20/18 1344 01/20/18 2138 01/21/18 0559  BP: 140/76 112/78 118/71 108/74  Pulse: (!) 55 (!) 52 (!) 51 (!) 51  Resp: 17  17 18   Temp: 98.4 F (36.9 C) 98.1 F (36.7 C) 98.1 F (36.7 C) 98.2 F (36.8 C)  TempSrc: Oral Oral Oral Oral  SpO2: 100% 100% 99% 98%  Weight:      Height: 5\' 5"  (1.651 m)       Gen:  NAD- tolerating diet   The results of significant diagnostics from this hospitalization (including imaging, microbiology, ancillary and laboratory) are listed below for reference.     Procedures and Diagnostic Studies:   US Abdomen Complete  Result Date: 01/20/2018 CLINICAL DATA:  Unspecified disorder of liver function. Elevated liver enzymes today. EXAM: ABDOMEN ULTRASOUND COMPLETE COMPARISON:  CT 01/18/2018 FINDINGS: Gallbladder: Surgically absent Common bile duct: Diameter: Top normal at 6 mm. No choledocholithiasis. Liver: No focal lesion identified. Within normal limits in parenchymal echogenicity. Portal vein is patent on color Doppler imaging with normal direction of blood flow towards the liver. IVC: No abnormality visualized. Pancreas: Visualized portion unremarkable. Spleen: Size and appearance within normal limits. Right Kidney: Length: 10.2 cm. Echogenicity within normal limits. No mass or hydronephrosis visualized. Left Kidney: Length: 10.6 cm. Echogenicity within normal limits. No mass or hydronephrosis visualized. Abdominal aorta: No aneurysm visualized. Other findings: Physiologic  distention of the urinary bladder with bilateral ureteral jets demonstrated suggesting patency of both ureters. IMPRESSION: No sonographic findings for the patient's elevated liver function tests. Status post cholecystectomy without biliary dilatation. No choledocholithiasis. Electronically Signed   By: Ashley Royalty M.D.   On: 01/20/2018 02:48   Mr 3d Recon At  Scanner  Result Date: 01/20/2018 CLINICAL DATA:  Inpatient. Epigastric abdominal pain, nausea and vomiting. Elevated liver function tests. Prior cholecystectomy. EXAM: MRI ABDOMEN WITHOUT AND WITH CONTRAST (INCLUDING MRCP) TECHNIQUE: Multiplanar multisequence MR imaging of the abdomen was performed both before and after the administration of intravenous contrast. Heavily T2-weighted images of the biliary and pancreatic ducts were obtained, and three-dimensional MRCP images were rendered by post processing. CONTRAST:  25mL MULTIHANCE GADOBENATE DIMEGLUMINE 529 MG/ML IV SOLN COMPARISON:  01/20/2018 abdominal sonogram. 01/18/2018 CT abdomen/pelvis. FINDINGS: Lower chest: No acute abnormality at the lung bases. Hepatobiliary: Normal liver size and configuration. Mild diffuse hepatic steatosis. No liver mass. Cholecystectomy. Bile ducts are within normal post cholecystectomy limits. Common bile duct diameter 6 mm. No choledocholithiasis. No biliary strictures or masses. Pancreas: No pancreatic mass or duct dilation. No convincing evidence of pancreas divisum. Spleen: Normal size. No mass. Adrenals/Urinary Tract: Normal adrenals. No hydronephrosis. Normal kidneys with no renal mass. Stomach/Bowel: Grossly normal stomach. Visualized small and large bowel is normal caliber, with no bowel wall thickening. Vascular/Lymphatic: Normal caliber abdominal aorta. Patent portal, splenic, hepatic and renal veins. No pathologically enlarged lymph nodes in the abdomen. Other: No abdominal ascites or focal fluid collection. Musculoskeletal: No aggressive appearing focal osseous lesions. IMPRESSION: 1. Mild diffuse hepatic steatosis.  No liver masses. 2. No acute abnormality. Bile ducts are within normal post cholecystectomy limits. No choledocholithiasis. Electronically Signed   By: Ilona Sorrel M.D.   On: 01/20/2018 10:25   Mr Abdomen Mrcp Moise Boring Contast  Result Date: 01/20/2018 CLINICAL DATA:  Inpatient. Epigastric abdominal pain,  nausea and vomiting. Elevated liver function tests. Prior cholecystectomy. EXAM: MRI ABDOMEN WITHOUT AND WITH CONTRAST (INCLUDING MRCP) TECHNIQUE: Multiplanar multisequence MR imaging of the abdomen was performed both before and after the administration of intravenous contrast. Heavily T2-weighted images of the biliary and pancreatic ducts were obtained, and three-dimensional MRCP images were rendered by post processing. CONTRAST:  83mL MULTIHANCE GADOBENATE DIMEGLUMINE 529 MG/ML IV SOLN COMPARISON:  01/20/2018 abdominal sonogram. 01/18/2018 CT abdomen/pelvis. FINDINGS: Lower chest: No acute abnormality at the lung bases. Hepatobiliary: Normal liver size and configuration. Mild diffuse hepatic steatosis. No liver mass. Cholecystectomy. Bile ducts are within normal post cholecystectomy limits. Common bile duct diameter 6 mm. No choledocholithiasis. No biliary strictures or masses. Pancreas: No pancreatic mass or duct dilation. No convincing evidence of pancreas divisum. Spleen: Normal size. No mass. Adrenals/Urinary Tract: Normal adrenals. No hydronephrosis. Normal kidneys with no renal mass. Stomach/Bowel: Grossly normal stomach. Visualized small and large bowel is normal caliber, with no bowel wall thickening. Vascular/Lymphatic: Normal caliber abdominal aorta. Patent portal, splenic, hepatic and renal veins. No pathologically enlarged lymph nodes in the abdomen. Other: No abdominal ascites or focal fluid collection. Musculoskeletal: No aggressive appearing focal osseous lesions. IMPRESSION: 1. Mild diffuse hepatic steatosis.  No liver masses. 2. No acute abnormality. Bile ducts are within normal post cholecystectomy limits. No choledocholithiasis. Electronically Signed   By: Ilona Sorrel M.D.   On: 01/20/2018 10:25     Labs:   Basic Metabolic Panel: Recent Labs  Lab 01/18/18 1944 01/19/18 2151 01/21/18 0359  NA 139 137 137  K 3.9 3.7 3.6  CL 103  105 104  CO2 25 23 25   GLUCOSE 109* 94 86  BUN 14 11  8   CREATININE 1.01* 0.99 0.93  CALCIUM 9.5 8.7* 8.6*   GFR Estimated Creatinine Clearance: 81.9 mL/min (by C-G formula based on SCr of 0.93 mg/dL). Liver Function Tests: Recent Labs  Lab 01/18/18 1944 01/19/18 2151 01/20/18 0743 01/21/18 0359  AST 90* 278* 133* 49*  ALT 47 348* 252* 152*  ALKPHOS 47 74 56 48  BILITOT 0.8 1.1 0.9 0.7  PROT 7.3 6.9 6.0* 5.4*  ALBUMIN 4.2 3.8 3.4* 3.1*   Recent Labs  Lab 01/18/18 1944 01/19/18 2151 01/20/18 0743  LIPASE 675* 28 25   No results for input(s): AMMONIA in the last 168 hours. Coagulation profile Recent Labs  Lab 01/20/18 0259 01/20/18 0743  INR 1.08 1.12    CBC: Recent Labs  Lab 01/18/18 1944 01/19/18 2151 01/21/18 0359  WBC 14.7* 5.2 5.2  HGB 14.7 13.8 12.0  HCT 43.7 41.9 36.9  MCV 89.2 90.3 90.4  PLT 292 269 220   Cardiac Enzymes: No results for input(s): CKTOTAL, CKMB, CKMBINDEX, TROPONINI in the last 168 hours. BNP: Invalid input(s): POCBNP CBG: Recent Labs  Lab 01/20/18 0614 01/20/18 0748 01/20/18 1629 01/21/18 0033 01/21/18 0742  GLUCAP 73 83 94 74 80   D-Dimer No results for input(s): DDIMER in the last 72 hours. Hgb A1c No results for input(s): HGBA1C in the last 72 hours. Lipid Profile No results for input(s): CHOL, HDL, LDLCALC, TRIG, CHOLHDL, LDLDIRECT in the last 72 hours. Thyroid function studies No results for input(s): TSH, T4TOTAL, T3FREE, THYROIDAB in the last 72 hours.  Invalid input(s): FREET3 Anemia work up No results for input(s): VITAMINB12, FOLATE, FERRITIN, TIBC, IRON, RETICCTPCT in the last 72 hours. Microbiology No results found for this or any previous visit (from the past 240 hour(s)).   Discharge Instructions:   Discharge Instructions    Discharge instructions   Complete by:  As directed    Slowly advance diet   Increase activity slowly   Complete by:  As directed      Allergies as of 01/21/2018   No Known Allergies     Medication List    TAKE these  medications   cetirizine 10 MG tablet Commonly known as:  ZYRTEC Take 10 mg by mouth at bedtime.   Cholecalciferol 1000 units capsule Take 1,000 Units by mouth at bedtime.   HYDROcodone-acetaminophen 5-325 MG tablet Commonly known as:  NORCO/VICODIN Take 2 tablets by mouth every 4 (four) hours as needed.   omeprazole 40 MG capsule Commonly known as:  PRILOSEC Take 40 mg by mouth at bedtime.   ondansetron 4 MG disintegrating tablet Commonly known as:  ZOFRAN ODT Take 1 tablet (4 mg total) by mouth every 8 (eight) hours as needed for nausea or vomiting.   thyroid 60 MG tablet Commonly known as:  ARMOUR Take 60 mg by mouth daily before breakfast.      Follow-up Information    Dorena Cookey, MD Follow up in 1 week(s).   Specialty:  Family Medicine Why:  GI referral for follow up Contact information: [address] [place] [id number] [phone]            Time coordinating discharge: 35 min  Signed:  Geradine Girt   [facility] 01/21/2018, 9:15 AM

## 2018-01-24 ENCOUNTER — Encounter: Payer: Self-pay | Admitting: Family Medicine

## 2018-01-24 ENCOUNTER — Ambulatory Visit (INDEPENDENT_AMBULATORY_CARE_PROVIDER_SITE_OTHER): Payer: BLUE CROSS/BLUE SHIELD | Admitting: Family Medicine

## 2018-01-24 ENCOUNTER — Inpatient Hospital Stay: Payer: Self-pay | Admitting: Family Medicine

## 2018-01-24 VITALS — BP 118/62 | HR 72 | Temp 98.2°F | Wt 173.0 lb

## 2018-01-24 DIAGNOSIS — R1013 Epigastric pain: Secondary | ICD-10-CM

## 2018-01-24 MED ORDER — PANTOPRAZOLE SODIUM 20 MG PO TBEC: 20.0000 mg | DELAYED_RELEASE_TABLET | Freq: Every day | ORAL | 1 refills | Status: AC

## 2018-01-24 NOTE — Patient Instructions (Signed)
Abdominal Pain, Adult Abdominal pain can be caused by many things. Often, abdominal pain is not serious and it gets better with no treatment or by being treated at home. However, sometimes abdominal pain is serious. Your health care provider will do a medical history and a physical exam to try to determine the cause of your abdominal pain. Follow these instructions at home:  Take over-the-counter and prescription medicines only as told by your health care provider. Do not take a laxative unless told by your health care provider.  Drink enough fluid to keep your urine clear or pale yellow.  Watch your condition for any changes.  Keep all follow-up visits as told by your health care provider. This is important. Contact a health care provider if:  Your abdominal pain changes or gets worse.  You are not hungry or you lose weight without trying.  You are constipated or have diarrhea for more than 2-3 days.  You have pain when you urinate or have a bowel movement.  Your abdominal pain wakes you up at night.  Your pain gets worse with meals, after eating, or with certain foods.  You are throwing up and cannot keep anything down.  You have a fever. Get help right away if:  Your pain does not go away as soon as your health care provider told you to expect.  You cannot stop throwing up.  Your pain is only in areas of the abdomen, such as the right side or the left lower portion of the abdomen.  You have bloody or black stools, or stools that look like tar.  You have severe pain, cramping, or bloating in your abdomen.  You have signs of dehydration, such as: ? Dark urine, very little urine, or no urine. ? Cracked lips. ? Dry mouth. ? Sunken eyes. ? Sleepiness. ? Weakness. This information is not intended to replace advice given to you by your health care provider. Make sure you discuss any questions you have with your health care provider. Document Released: 07/11/2005 Document  Revised: 04/20/2016 Document Reviewed: 03/14/2016 Elsevier Interactive Patient Education  2018 [place] Diet A bland diet consists of foods that do not have a lot of fat or fiber. Foods without fat or fiber are easier for the body to digest. They are also less likely to irritate your mouth, throat, stomach, and other parts of your gastrointestinal tract. A bland diet is sometimes called a BRAT diet. What is my plan? Your health care provider or dietitian may recommend specific changes to your diet to prevent and treat your symptoms, such as:  Eating small meals often.  Cooking food until it is soft enough to chew easily.  Chewing your food well.  Drinking fluids slowly.  Not eating foods that are very spicy, sour, or fatty.  Not eating citrus fruits, such as oranges and grapefruit.  What do I need to know about this diet?  Eat a variety of foods from the bland diet food list.  Do not follow a bland diet longer than you have to.  Ask your health care provider whether you should take vitamins. What foods can I eat? Grains  Hot cereals, such as cream of wheat. Bread, crackers, or tortillas made from refined white flour. Rice. Vegetables Canned or cooked vegetables. Mashed or boiled potatoes. Fruits Bananas. Applesauce. Other types of cooked or canned fruit with the skin and seeds removed, such as canned peaches or pears. Meats and Other Protein Sources Scrambled eggs. Creamy  peanut butter or other nut butters. Lean, well-cooked meats, such as chicken or fish. Tofu. Soups or broths. Dairy Low-fat dairy products, such as milk, cottage cheese, or yogurt. Beverages Water. Herbal tea. Apple juice. Sweets and Desserts Pudding. Custard. Fruit gelatin. Ice cream. Fats and Oils Mild salad dressings. Canola or olive oil. The items listed above may not be a complete list of allowed foods or beverages. Contact your dietitian for more options. What foods are not  recommended? Foods and ingredients that are often not recommended include:  Spicy foods, such as hot sauce or salsa.  Fried foods.  Sour foods, such as pickled or fermented foods.  Raw vegetables or fruits, especially citrus or berries.  Caffeinated drinks.  Alcohol.  Strongly flavored seasonings or condiments.  The items listed above may not be a complete list of foods and beverages that are not allowed. Contact your dietitian for more information. This information is not intended to replace advice given to you by your health care provider. Make sure you discuss any questions you have with your health care provider. Document Released: 01/23/2016 Document Revised: 03/08/2016 Document Reviewed: 10/13/2014 Elsevier Interactive Patient Education  2018 [employer].

## 2018-01-24 NOTE — Progress Notes (Signed)
Subjective:    Patient ID: Kayla Mccarthy, female    DOB: 1976-04-14, 42 y.o.   MRN: [id number]  Chief Complaint  Patient presents with  . Follow-up    HPI Patient was seen today for hospital follow-up.  Patient was seen on 01/18/18 for epigastric pain by her OB office, was then sent to the ED. patient was told she had pancreatitis as lipase elevated at 695, CT scan was normal.  Patient was sent home with Vicodin and Zofran on clear liquid diet x 12 hours then advised to advance diet.  The next day patient return to the ED with persistent abdominal pain.  She was admitted from 4/7-01/21/18.  Given elevated LFTs and bili but there was concern for obstructing stone.  Patient underwent MRCP which was negative.  It was expected that if patient has stone in the past.  Pt w/ h/o cholecystectomy.  Fatty liver was also noted.  Since discharge patient endorses continued abdominal pain.  Patient states she has been unable to advance her diet from clears.  She states she tried tomato soup yesterday but it caused increased abdominal pain/cramping and watery stools.  Patient requesting Protonix as she felt like it worked better in the hospital than her Prilosec. Past Medical History:  Diagnosis Date  . Allergic rhinitis   . Cholelithiasis   . Family history of adverse reaction to anesthesia    "mom gets really sick; has to have Zofran, etc" (01/20/2018)  . Heart murmur    "as a baby; think it resolved" (01/20/2018)  . Hypothyroidism   . Miscarriage    x 2  . Urticaria     No Known Allergies  ROS General: Denies fever, chills, night sweats, changes in weight, changes in appetite HEENT: Denies headaches, ear pain, changes in vision, rhinorrhea, sore throat CV: Denies CP, palpitations, SOB, orthopnea Pulm: Denies SOB, cough, wheezing GI: Denies nausea, vomiting, diarrhea, constipation +abdominal pain, abdominal cramping GU: Denies dysuria, hematuria, frequency, vaginal discharge Msk: Denies muscle  cramps, joint pains Neuro: Denies weakness, numbness, tingling Skin: Denies rashes, bruising Psych: Denies depression, anxiety, hallucinations     Objective:    Blood pressure 118/62, pulse 72, temperature 98.2 F (36.8 C), temperature source Oral, weight 173 lb (78.5 kg), last menstrual period 01/19/2018, SpO2 98 %.   Gen. Pleasant, well-nourished, in no distress, normal affect   HEENT: [place]/AT, face symmetric, no scleral icterus, PERRLA,nares patent without drainage Lungs: no accessory muscle use, CTAB, no wheezes or rales Cardiovascular: RRR, no m/r/g, no peripheral edema Abdomen: BS present, soft, diffuse tenderness, no rebound tenderness, ND, no hepatosplenomegaly. Neuro:  A&Ox3, CN II-XII intact, normal gait   Wt Readings from Last 3 Encounters:  01/24/18 173 lb (78.5 kg)  01/19/18 178 lb (80.7 kg)  01/18/18 178 lb (80.7 kg)    Lab Results  Component Value Date   WBC 5.2 01/21/2018   HGB 12.0 01/21/2018   HCT 36.9 01/21/2018   PLT 220 01/21/2018   GLUCOSE 86 01/21/2018   CHOL 133 11/29/2010   TRIG 74.0 11/29/2010   HDL 39.20 11/29/2010   LDLCALC 79 11/29/2010   ALT 152 (H) 01/21/2018   AST 49 (H) 01/21/2018   NA 137 01/21/2018   K 3.6 01/21/2018   CL 104 01/21/2018   CREATININE 0.93 01/21/2018   BUN 8 01/21/2018   CO2 25 01/21/2018   TSH 1.12 11/29/2010   INR 1.12 01/20/2018    Assessment/Plan:  Epigastric pain  -Advised to continue on clears and  attempt to advance diet. -Continue using Zofran as needed -Patient given Rx for Protonix -Referral for GI placed  - Plan: Ambulatory referral to Gastroenterology, Amylase, Lipase, Comprehensive metabolic panel, CBC with Differential/Platelet, -Patient given RTC/80 precautions given the upcoming weekend.  Follow-up PRN with PCP  Grier Mitts, MD

## 2018-01-25 LAB — COMPREHENSIVE METABOLIC PANEL
AG Ratio: 1.8 (calc) (ref 1.0–2.5)
ALKALINE PHOSPHATASE (APISO): 47 U/L (ref 33–115)
ALT: 140 U/L — ABNORMAL HIGH (ref 6–29)
AST: 49 U/L — ABNORMAL HIGH (ref 10–30)
Albumin: 4.3 g/dL (ref 3.6–5.1)
BUN: 9 mg/dL (ref 7–25)
CHLORIDE: 106 mmol/L (ref 98–110)
CO2: 30 mmol/L (ref 20–32)
CREATININE: 0.92 mg/dL (ref 0.50–1.10)
Calcium: 9.7 mg/dL (ref 8.6–10.2)
GLOBULIN: 2.4 g/dL (ref 1.9–3.7)
Glucose, Bld: 87 mg/dL (ref 65–99)
Potassium: 4.7 mmol/L (ref 3.5–5.3)
Sodium: 140 mmol/L (ref 135–146)
Total Bilirubin: 0.6 mg/dL (ref 0.2–1.2)
Total Protein: 6.7 g/dL (ref 6.1–8.1)

## 2018-01-25 LAB — CBC WITH DIFFERENTIAL/PLATELET
BASOS PCT: 0.8 %
Basophils Absolute: 50 cells/uL (ref 0–200)
Eosinophils Absolute: 290 cells/uL (ref 15–500)
Eosinophils Relative: 4.6 %
HCT: 42.4 % (ref 35.0–45.0)
Hemoglobin: 14.3 g/dL (ref 11.7–15.5)
Lymphs Abs: 1997 cells/uL (ref 850–3900)
MCH: 29.5 pg (ref 27.0–33.0)
MCHC: 33.7 g/dL (ref 32.0–36.0)
MCV: 87.6 fL (ref 80.0–100.0)
MPV: 9.7 fL (ref 7.5–12.5)
Monocytes Relative: 9.9 %
Neutro Abs: 3339 cells/uL (ref 1500–7800)
Neutrophils Relative %: 53 %
PLATELETS: 302 10*3/uL (ref 140–400)
RBC: 4.84 10*6/uL (ref 3.80–5.10)
RDW: 12.2 % (ref 11.0–15.0)
TOTAL LYMPHOCYTE: 31.7 %
WBC: 6.3 10*3/uL (ref 3.8–10.8)
WBCMIX: 624 {cells}/uL (ref 200–950)

## 2018-01-25 LAB — AMYLASE: AMYLASE: 27 U/L (ref 21–101)

## 2018-01-25 LAB — LIPASE: LIPASE: 14 U/L (ref 7–60)

## 2018-01-27 ENCOUNTER — Telehealth: Payer: Self-pay | Admitting: Family Medicine

## 2018-01-27 NOTE — Telephone Encounter (Signed)
Copied from [place] [id number]. Topic: Quick Communication - See Telephone Encounter >> Jan 27, 2018  1:47 PM Vernona Rieger wrote: CRM for notification. See Telephone encounter for: 01/27/18.  Patient is requesting her labs from 4/12. Please call at [phone]

## 2018-01-27 NOTE — Telephone Encounter (Signed)
Pt is requesting lab results.

## 2018-01-28 NOTE — Telephone Encounter (Signed)
FYI

## 2018-01-28 NOTE — Telephone Encounter (Signed)
Pt. called back for lab results.  Informed of result note by Dr. Volanda Napoleon on 01/27/18.  Verb. Understanding.  Reported she has an appt. with Dr. Ronnette Juniper on 02/12/18 @ [facility].   Pt. wanted Dr. Volanda Napoleon to know that She has been drinking Boost, and starting to eat bland foods.  Her BM's are beginning to have more consistency.  The Protonix is helping.  She is struggling to work her 1/2 days.  Stated when she is finished with 4 hrs., she feels like she has worked 8 hrs., due to her weakness.  Advised will send a message to Dr. Volanda Napoleon.  Agrees with plan.  (unable to chart on Result Note, as not routed to the [facility].)

## 2018-01-29 ENCOUNTER — Ambulatory Visit: Payer: Self-pay | Admitting: *Deleted

## 2018-01-29 NOTE — Telephone Encounter (Signed)
Patient phoned complaining her upper abdomen felt bloated and with burning pain that is intermittent today. She was hospitalized earlier this month with Pancreatitis. She saw Dr. Volanda Napoleon on 4/12 for hospital follow up. She stated the pain today feels like the pain she had just prior to the pancreatitis and she feels short of breath due to the bloating feeling (She also stated the SOB could be from some anxiety over concern that she may become sick again with the pancreatitis) which began today after drinking coffee this morning. She continues to be on a soft diet and liquids. She had 3 loose to soft stools day before yesterday and none yesterday but feels though she needs to have one today but unable to. She is taking the protonix and she took a zofran as we were talking. She is asking if the GI Doctor could be contacted to get an earlier appointment than May 1st. Protocol advises being seen in the office.   Answer Assessment - Initial Assessment Questions 1. LOCATION: "Where does it hurt?"      Upper abdomen feels bloated today. 2. RADIATION: "Does the pain shoot anywhere else?" (e.g., chest, back) no 3. ONSET: "When did the pain begin?" (e.g., minutes, hours or days ago)   This morning. 4. SUDDEN: "Gradual or sudden onset?"  gradual this morning after she felt like she needed to have a BM but couldn't 5. PATTERN "Does the pain come and go, or is it constant?"    - If constant: "Is it getting better, staying the same, or worsening?"      (Note: Constant means the pain never goes away completely; most serious pain is constant and it progresses)     - If intermittent: "How long does it last?" "Do you have pain now?"     (Note: Intermittent means the pain goes away completely between bouts)     The bloated, burning pain is intermittent today. 6. SEVERITY: "How bad is the pain?"  (e.g., Scale 1-10; mild, moderate, or severe)   - MILD (1-3): doesn't interfere with normal activities, abdomen soft and not  tender to touch    - MODERATE (4-7): interferes with normal activities or awakens from sleep, tender to touch    - SEVERE (8-10): excruciating pain, doubled over, unable to do any normal activities     Moderate 7. RECURRENT SYMPTOM: "Have you ever had this type of abdominal pain before?" If so, ask: "When was the last time?" and "What happened that time?"  She was recently admitted to the hospital with pancreatitis.  8. CAUSE: "What do you think is causing the abdominal pain?"     This pain feels the same as just before the pancreatitis started.  9. RELIEVING/AGGRAVATING FACTORS: "What makes it better or worse?" (e.g., movement, antacids, bowel movement)    It got worse after  Drinking coffee today.  10. OTHER SYMPTOMS: "Has there been any vomiting, diarrhea, constipation, or urine problems?"       2 days ago she had 3 loose to soft stools. None yesterday. None today. 11. PREGNANCY: "Is there any chance you are pregnant?" "When was your last menstrual period?"      no  Protocols used: ABDOMINAL PAIN - [facility]

## 2018-01-30 ENCOUNTER — Encounter: Payer: Self-pay | Admitting: Family Medicine

## 2018-01-30 ENCOUNTER — Ambulatory Visit (INDEPENDENT_AMBULATORY_CARE_PROVIDER_SITE_OTHER): Payer: BLUE CROSS/BLUE SHIELD | Admitting: Family Medicine

## 2018-01-30 VITALS — BP 102/68 | HR 83 | Temp 98.3°F | Wt 170.8 lb

## 2018-01-30 DIAGNOSIS — R1013 Epigastric pain: Secondary | ICD-10-CM

## 2018-01-30 LAB — COMPREHENSIVE METABOLIC PANEL
ALT: 42 U/L — ABNORMAL HIGH (ref 0–35)
AST: 15 U/L (ref 0–37)
Albumin: 4.5 g/dL (ref 3.5–5.2)
Alkaline Phosphatase: 42 U/L (ref 39–117)
BUN: 13 mg/dL (ref 6–23)
CHLORIDE: 102 meq/L (ref 96–112)
CO2: 29 meq/L (ref 19–32)
CREATININE: 0.95 mg/dL (ref 0.40–1.20)
Calcium: 9.6 mg/dL (ref 8.4–10.5)
GFR: 68.03 mL/min (ref >60.00)
GLUCOSE: 85 mg/dL (ref 70–99)
Potassium: 4.6 mEq/L (ref 3.5–5.1)
SODIUM: 138 meq/L (ref 135–145)
Total Bilirubin: 0.7 mg/dL (ref 0.2–1.2)
Total Protein: 7.1 g/dL (ref 6.0–8.3)

## 2018-01-30 LAB — CBC
HEMATOCRIT: 44.1 % (ref 36.0–46.0)
Hemoglobin: 14.7 g/dL (ref 12.0–15.0)
MCHC: 33.4 g/dL (ref 30.0–36.0)
MCV: 89.7 fl (ref 78.0–100.0)
Platelets: 270 10*3/uL (ref 150.0–400.0)
RBC: 4.91 Mil/uL (ref 3.87–5.11)
RDW: 13.1 % (ref 11.5–15.5)
WBC: 6.5 10*3/uL (ref 4.0–10.5)

## 2018-01-30 NOTE — Progress Notes (Signed)
Subjective:    Patient ID: Kayla Mccarthy, female    DOB: [birth year], 42 y.o.   MRN: [id number]  No chief complaint on file.   HPI Patient was seen today for continued abd pain.  Pt states she has been eating grits, toast, cottage cheese, and canned peaches.  Pt denies any nausea or abdominal pain with these foods.  Pt has also been drinking boost.  Pt has not had to take the norco or zofran.  Pt states she was feeling beter until yesterday when she experienced an episode of abdominal bloating/pain which caused shortness of breath.  The episode lasted a few minutes.  She took a zofran Pt did not feel like the pain was bad enough to go to the ED.  Pt became scared as she states she had a similar episode prior to her abdominal pain which sent her to the emergency room.  Pt asking for GI appointment can be made sooner and if she can get repeat labs.  Pt has been trying to work 4 hours at work, but states she is exhausted.  Past Medical History:  Diagnosis Date  . Allergic rhinitis   . Cholelithiasis   . Family history of adverse reaction to anesthesia    "mom gets really sick; has to have Zofran, etc" (01/20/2018)  . Heart murmur    "as a baby; think it resolved" (01/20/2018)  . Hypothyroidism   . Miscarriage    x 2  . Urticaria     No Known Allergies  ROS General: Denies fever, chills, night sweats, changes in weight, changes in appetite HEENT: Denies headaches, ear pain, changes in vision, rhinorrhea, sore throat CV: Denies CP, palpitations, SOB, orthopnea Pulm: Denies SOB, cough, wheezing GI: Denies nausea, vomiting, diarrhea, constipation  +abdominal pain GU: Denies dysuria, hematuria, frequency, vaginal discharge Msk: Denies muscle cramps, joint pains Neuro: Denies weakness, numbness, tingling Skin: Denies rashes, bruising Psych: Denies depression, anxiety, hallucinations     Objective:    Blood pressure 102/68, pulse 83, temperature 98.3 F (36.8 C), temperature source Oral,  weight 170 lb 12.8 oz (77.5 kg), last menstrual period 01/19/2018, SpO2 97 %.   Gen. Pleasant, well-nourished, in no distress, normal affect   HEENT: [place]/AT, face symmetric no scleral icterus, PERRLA, nares patent without drainage Lungs: no accessory muscle use, CTAB, no wheezes or rales Cardiovascular: RRR, no m/r/g, no peripheral edema Abdomen: BS present, soft, diffuse TTP Neuro:  A&Ox3, CN II-XII intact, normal gait   Wt Readings from Last 3 Encounters:  01/24/18 173 lb (78.5 kg)  01/19/18 178 lb (80.7 kg)  01/18/18 178 lb (80.7 kg)    Lab Results  Component Value Date   WBC 6.3 01/24/2018   HGB 14.3 01/24/2018   HCT 42.4 01/24/2018   PLT 302 01/24/2018   GLUCOSE 87 01/24/2018   CHOL 133 11/29/2010   TRIG 74.0 11/29/2010   HDL 39.20 11/29/2010   LDLCALC 79 11/29/2010   ALT 140 (H) 01/24/2018   AST 49 (H) 01/24/2018   NA 140 01/24/2018   K 4.7 01/24/2018   CL 106 01/24/2018   CREATININE 0.92 01/24/2018   BUN 9 01/24/2018   CO2 30 01/24/2018   TSH 1.12 11/29/2010   INR 1.12 01/20/2018    Assessment/Plan:  Epigastric pain  -Pt advised to keep scheduled GI appt. -pt advised she may be able to get on the cancellation list. -pt advised to start a clear diet again and advance as tolerated. -pt advised to refrain from  heavy lifting at work. - Plan: Comprehensive metabolic panel, CBC (no diff) -pt given ED or RTC precautions for worsening symoptoms.  Grier Mitts, MD

## 2018-02-01 ENCOUNTER — Other Ambulatory Visit: Payer: Self-pay

## 2018-02-01 ENCOUNTER — Encounter (HOSPITAL_COMMUNITY): Payer: Self-pay

## 2018-02-01 ENCOUNTER — Emergency Department (HOSPITAL_COMMUNITY)
Admission: EM | Admit: 2018-02-01 | Discharge: 2018-02-02 | Disposition: A | Discharge status: Home / Self Care | Payer: BLUE CROSS/BLUE SHIELD | Attending: Emergency Medicine | Admitting: Emergency Medicine

## 2018-02-01 DIAGNOSIS — Z9049 Acquired absence of other specified parts of digestive tract: Secondary | ICD-10-CM | POA: Diagnosis not present

## 2018-02-01 DIAGNOSIS — K5900 Constipation, unspecified: Secondary | ICD-10-CM | POA: Insufficient documentation

## 2018-02-01 DIAGNOSIS — Z79899 Other long term (current) drug therapy: Secondary | ICD-10-CM | POA: Diagnosis not present

## 2018-02-01 DIAGNOSIS — R1084 Generalized abdominal pain: Secondary | ICD-10-CM | POA: Insufficient documentation

## 2018-02-01 DIAGNOSIS — E039 Hypothyroidism, unspecified: Secondary | ICD-10-CM | POA: Diagnosis not present

## 2018-02-01 DIAGNOSIS — R109 Unspecified abdominal pain: Secondary | ICD-10-CM | POA: Diagnosis present

## 2018-02-01 HISTORY — DX: Acute pancreatitis without necrosis or infection, unspecified: K85.90

## 2018-02-01 LAB — COMPREHENSIVE METABOLIC PANEL
ALBUMIN: 4.1 g/dL (ref 3.5–5.0)
ALT: 31 U/L (ref 14–54)
ANION GAP: 10 (ref 5–15)
AST: 17 U/L (ref 15–41)
Alkaline Phosphatase: 44 U/L (ref 38–126)
BILIRUBIN TOTAL: 0.5 mg/dL (ref 0.3–1.2)
BUN: 8 mg/dL (ref 6–20)
CHLORIDE: 105 mmol/L (ref 101–111)
CO2: 23 mmol/L (ref 22–32)
Calcium: 9.5 mg/dL (ref 8.9–10.3)
Creatinine, Ser: 0.82 mg/dL (ref 0.44–1.00)
GFR calc Af Amer: >60 mL/min (ref >60)
Glucose, Bld: 103 mg/dL — ABNORMAL HIGH (ref 65–99)
POTASSIUM: 4.2 mmol/L (ref 3.5–5.1)
Sodium: 138 mmol/L (ref 135–145)
TOTAL PROTEIN: 7.5 g/dL (ref 6.5–8.1)

## 2018-02-01 LAB — I-STAT BETA HCG BLOOD, ED (MC, WL, AP ONLY): I-stat hCG, quantitative: <5 m[IU]/mL (ref <5)

## 2018-02-01 LAB — URINALYSIS, ROUTINE W REFLEX MICROSCOPIC
Bilirubin Urine: NEGATIVE
GLUCOSE, UA: NEGATIVE mg/dL
Hgb urine dipstick: NEGATIVE
Ketones, ur: NEGATIVE mg/dL
LEUKOCYTES UA: NEGATIVE
NITRITE: NEGATIVE
PH: 7 (ref 5.0–8.0)
Protein, ur: NEGATIVE mg/dL
SPECIFIC GRAVITY, URINE: 1.008 (ref 1.005–1.030)

## 2018-02-01 LAB — CBC
HEMATOCRIT: 42.6 % (ref 36.0–46.0)
HEMOGLOBIN: 14.6 g/dL (ref 12.0–15.0)
MCH: 30.2 pg (ref 26.0–34.0)
MCHC: 34.3 g/dL (ref 30.0–36.0)
MCV: 88.2 fL (ref 78.0–100.0)
Platelets: 289 10*3/uL (ref 150–400)
RBC: 4.83 MIL/uL (ref 3.87–5.11)
RDW: 12.6 % (ref 11.5–15.5)
WBC: 8.7 10*3/uL (ref 4.0–10.5)

## 2018-02-01 LAB — LIPASE, BLOOD: LIPASE: 28 U/L (ref 11–51)

## 2018-02-01 NOTE — ED Notes (Signed)
Pt c/o abd pain with constipation x's 5 days.  Pt st's she has tried [place] lax without relief

## 2018-02-01 NOTE — ED Triage Notes (Signed)
Pt states that she was admitted two weeks ago for pancreatitis. Has not been able to see GI yet, reports no BM since Tuesday, some nausea and lower abd pain. Pt has tried Miralax at home

## 2018-02-02 ENCOUNTER — Emergency Department (HOSPITAL_COMMUNITY): Payer: BLUE CROSS/BLUE SHIELD

## 2018-02-02 MED ORDER — MAGNESIUM CITRATE PO SOLN: 1.0000 | Freq: Once | ORAL | 0 refills | Status: AC

## 2018-02-02 MED ORDER — HYDROMORPHONE HCL 2 MG/ML IJ SOLN
1.0000 mg | Freq: Once | INTRAMUSCULAR | Status: AC
  Administered 2018-02-02: 1 mg via INTRAMUSCULAR
  Filled 2018-02-02: qty 1

## 2018-02-02 MED ORDER — DOCUSATE SODIUM 100 MG PO CAPS: 100.0000 mg | ORAL_CAPSULE | Freq: Two times a day (BID) | ORAL | 0 refills | Status: AC

## 2018-02-02 MED ORDER — ONDANSETRON 4 MG PO TBDP
8.0000 mg | ORAL_TABLET | Freq: Once | ORAL | Status: AC
  Administered 2018-02-02: 8 mg via ORAL
  Filled 2018-02-02: qty 2

## 2018-02-02 MED ORDER — LACTULOSE 10 GM/15ML PO SOLN: 10.0000 g | Freq: Three times a day (TID) | ORAL | 0 refills | Status: AC | PRN

## 2018-02-02 MED ORDER — ONDANSETRON HCL 4 MG/2ML IJ SOLN: 4.0000 mg | Freq: Once | INTRAMUSCULAR | Status: DC

## 2018-02-02 MED ORDER — MORPHINE SULFATE (PF) 4 MG/ML IV SOLN: 4.0000 mg | Freq: Once | INTRAVENOUS | Status: DC

## 2018-02-02 NOTE — ED Provider Notes (Signed)
[facility]PART[facility] Provider Note   CSN: [id number] Arrival date & time: 02/01/18  1925     History   Chief Complaint Chief Complaint  Patient presents with  . Abdominal Pain    HPI Kayla Mccarthy is a 42 y.o. female.  Patient presents to the emergency department for evaluation of abdominal pain.  Patient was hospitalized 2 weeks ago for acute pancreatitis.  Since leaving the hospital she has had some abdominal pain.  She had an episode several days ago that resolved.  Today she had severe cramping and is felt constipated.  She reports that she sat on the toilet all day today but cannot pass any stool.     Past Medical History:  Diagnosis Date  . Allergic rhinitis   . Cholelithiasis   . Family history of adverse reaction to anesthesia    "mom gets really sick; has to have Zofran, etc" (01/20/2018)  . Heart murmur    "as a baby; think it resolved" (01/20/2018)  . Hypothyroidism   . Miscarriage    x 2  . Pancreatitis   . Urticaria     Patient Active Problem List   Diagnosis Date Noted  . Epigastric pain 01/20/2018  . Abnormal LFTs 01/20/2018  . Hypothyroidism 12/04/2010  . BREAST MASS, RIGHT 04/12/2008  . UNSPECIFIED URTICARIA 04/12/2008  . CHOLELITHIASIS 03/03/2008  . CONTACT DERMATITIS&OTH ECZEMA DUE OTH [place] AGENT 03/03/2008  . ALLERGIC RHINITIS 08/08/2007    Past Surgical History:  Procedure Laterality Date  . ANTERIOR AND POSTERIOR REPAIR  10/2005  . DILATION AND CURETTAGE OF UTERUS    . ENDOMETRIAL ABLATION    . LAPAROSCOPIC CHOLECYSTECTOMY  01/2004  . MOHS SURGERY     "right posterior; 2 on my back" (01/20/2018)     OB History    Gravida  7   Para  2   Term  2   Preterm      AB  5   Living  2     SAB  5   TAB      Ectopic      Multiple      Live Births  2            Home Medications    Prior to Admission medications   Medication Sig Start Date End Date Taking? Authorizing Provider  cetirizine  (ZYRTEC) 10 MG tablet Take 10 mg by mouth at bedtime.    Yes [provider]  Cholecalciferol (VITAMIN D3) 1000 UNITS capsule Take 1,000 Units by mouth at bedtime.    Yes [provider]  HYDROcodone-acetaminophen (NORCO/VICODIN) 5-325 MG tablet Take 2 tablets by mouth every 4 (four) hours as needed. 01/18/18  Yes Caryl Ada K, PA-C  ondansetron (ZOFRAN ODT) 4 MG disintegrating tablet Take 1 tablet (4 mg total) by mouth every 8 (eight) hours as needed for nausea or vomiting. 01/21/18  Yes Vann, Jessica U, DO  pantoprazole (PROTONIX) 20 MG tablet Take 1 tablet (20 mg total) by mouth daily. 01/24/18  Yes Billie Ruddy, MD  thyroid (ARMOUR) 60 MG tablet Take 60 mg by mouth daily before breakfast.   Yes [provider]  docusate sodium (COLACE) 100 MG capsule Take 1 capsule (100 mg total) by mouth every 12 (twelve) hours. 02/02/18   Orpah Greek, MD  lactulose (CHRONULAC) 10 GM/15ML solution Take 15 mLs (10 g total) by mouth 3 (three) times daily as needed for moderate constipation or severe constipation. 02/02/18  Orpah Greek, MD  magnesium citrate SOLN Take 296 mLs (1 Bottle total) by mouth once for 1 dose. 02/02/18 02/02/18  Orpah Greek, MD    Family History Family History  Problem Relation Age of Onset  . Hiatal hernia Mother   . Gallbladder disease Mother   . Obesity Mother   . Crohn's disease Mother   . Hyperlipidemia Mother   . Diabetes Mother   . Irritable bowel syndrome Mother   . Hypertension Mother   . Irritable bowel syndrome Sister   . Diabetes Brother     Social History Social History   Tobacco Use  . Smoking status: Never Smoker  . Smokeless tobacco: Never Used  Substance Use Topics  . Alcohol use: Yes    Comment: 01/20/2018 "might have a couple drinks q couple weeks"  . Drug use: No     Allergies   Patient has no known allergies.   Review of Systems Review of Systems  Gastrointestinal: Positive for  abdominal pain and constipation.  All other systems reviewed and are negative.    Physical Exam Updated Vital Signs BP 130/79   Pulse 88   Temp 98.5 F (36.9 C) (Oral)   Resp 18   LMP 01/19/2018 Comment: negative preg test  SpO2 99%   Physical Exam  Constitutional: She is oriented to person, place, and time. She appears well-developed and well-nourished. No distress.  HENT:  Head: Normocephalic and atraumatic.  Right Ear: Hearing normal.  Left Ear: Hearing normal.  Nose: Nose normal.  Mouth/Throat: Oropharynx is clear and moist and mucous membranes are normal.  Eyes: Pupils are equal, round, and reactive to light. Conjunctivae and EOM are normal.  Neck: Normal range of motion. Neck supple.  Cardiovascular: Regular rhythm, S1 normal and S2 normal. Exam reveals no gallop and no friction rub.  No murmur heard. Pulmonary/Chest: Effort normal and breath sounds normal. No respiratory distress. She exhibits no tenderness.  Abdominal: Soft. Normal appearance and bowel sounds are normal. There is no hepatosplenomegaly. There is generalized tenderness. There is no rebound, no guarding, no tenderness at McBurney's point and negative Murphy's sign. No hernia.  Musculoskeletal: Normal range of motion.  Neurological: She is alert and oriented to person, place, and time. She has normal strength. No cranial nerve deficit or sensory deficit. Coordination normal. GCS eye subscore is 4. GCS verbal subscore is 5. GCS motor subscore is 6.  Skin: Skin is warm, dry and intact. No rash noted. No cyanosis.  Psychiatric: She has a normal mood and affect. Her speech is normal and behavior is normal. Thought content normal.  Nursing note and vitals reviewed.    ED Treatments / Results  Labs (all labs ordered are listed, but only abnormal results are displayed) Labs Reviewed  COMPREHENSIVE METABOLIC PANEL - Abnormal; Notable for the following components:      Result Value   Glucose, Bld 103 (*)    All  other components within normal limits  URINALYSIS, ROUTINE W REFLEX MICROSCOPIC - Abnormal; Notable for the following components:   Color, Urine STRAW (*)    All other components within normal limits  LIPASE, BLOOD  CBC  I-STAT BETA HCG BLOOD, ED ([facility], [facility], [facility] ONLY)    EKG None  Radiology Dg Abd Acute W/chest  Result Date: 02/02/2018 CLINICAL DATA:  Acute onset of nausea and lower abdominal pain. Lack of bowel movements. EXAM: DG ABDOMEN ACUTE W/ 1V CHEST COMPARISON:  CT of the abdomen and pelvis performed 01/18/2018, and  chest radiograph performed 04/15/2012 FINDINGS: The lungs are well-aerated and clear. There is no evidence of focal opacification, pleural effusion or pneumothorax. The cardiomediastinal silhouette is within normal limits. The visualized bowel gas pattern is unremarkable. Scattered stool and air are seen within the colon; there is no evidence of small bowel dilatation to suggest obstruction. No free intra-abdominal air is identified on the provided upright view. No acute osseous abnormalities are seen; the sacroiliac joints are unremarkable in appearance. Clips are noted within the right upper quadrant, reflecting prior cholecystectomy. Clips are noted at the left hemipelvis. IMPRESSION: 1. Unremarkable bowel gas pattern; no free intra-abdominal air seen. Small amount of stool noted in the colon. 2. No acute cardiopulmonary process seen. Electronically Signed   By: Garald Balding M.D.   On: 02/02/2018 00:37    Procedures Procedures (including critical care time)  Medications Ordered in ED Medications  morphine 4 MG/ML injection 4 mg (has no administration in time range)  ondansetron (ZOFRAN) injection 4 mg (has no administration in time range)     Initial Impression / Assessment and Plan / ED Course  I have reviewed the triage vital signs and the nursing notes.  Pertinent labs & imaging results that were available during my care of the patient were reviewed by me and  considered in my medical decision making (see chart for details).     Patient presents with abdominal pain.  She recently was treated for acute pancreatitis of unclear etiology.  MRCP during her hospitalization did not show any acute abnormality.  Patient now experiencing constipation with pain.  She has a history of constipation in the past and an anal fissure.  She is concerned that she is going to develop another fissure.  Lab work today is reassuring.  LFTs and lipase have returned to normal.  Examination is unremarkable, no focal findings.  X-ray does show stool in the transverse and descending colon.  Rectal exam revealed soft stool in the rectum, no impaction.  She has follow-up scheduled with GI in 2 days.  Will prescribe bowel prep.  Final Clinical Impressions(s) / ED Diagnoses   Final diagnoses:  Generalized abdominal pain  Constipation, unspecified constipation type    ED Discharge Orders        Ordered    docusate sodium (COLACE) 100 MG capsule  Every 12 hours     02/02/18 0117    magnesium citrate SOLN   Once     02/02/18 0117    lactulose (CHRONULAC) 10 GM/15ML solution  3 times daily PRN     02/02/18 0117       Orpah Greek, MD 02/02/18 [id number]

## 2018-02-03 ENCOUNTER — Encounter: Payer: Self-pay | Admitting: Family Medicine

## 2018-04-28 ENCOUNTER — Other Ambulatory Visit: Payer: Self-pay

## 2018-05-14 ENCOUNTER — Other Ambulatory Visit: Payer: Self-pay | Admitting: Family Medicine

## 2018-05-14 MED ORDER — THYROID 60 MG PO TABS: 60.0000 mg | ORAL_TABLET | Freq: Every day | ORAL | 0 refills | Status: AC

## 2018-05-14 NOTE — Telephone Encounter (Signed)
Please advise refill request in Dr. Honor Junes absence. Thanks!

## 2018-05-14 NOTE — Telephone Encounter (Signed)
Okay for refill?  

## 2018-05-14 NOTE — Telephone Encounter (Signed)
°  Relation to NP:HQNE  Call back number: [phone]  Pharmacy: [id number] - [place], [place] - [address] [phone] (Phone) [phone] (Fax)    Reason for call:   Patient checking on the status of thyroid (ARMOUR) 60 MG tablet request. Patient states endocrinologist recommended PCP prescribe going forward  because she's been doing so well on medication. Patient stated Dr. Sherren Mocha checked labs in April. Patient would like a follow up call regarding status of medication, please advise

## 2018-05-14 NOTE — Telephone Encounter (Signed)
Medication filled to pharmacy as requested. Left message to notify patient.

## 2018-05-14 NOTE — Telephone Encounter (Signed)
Copied from [place] [id number]. Topic: Quick Communication - See Telephone Encounter >> May 12, 2018  8:22 AM Loma [place] wrote: CRM for notification. See Telephone encounter for: 05/12/18. New cell [phone] >> May 13, 2018  5:13 PM Percell Belt A wrote: Pt called in to check the status of the refill on this med?  She was going to try to get her endo dr do it but she is out of town.   [facility] [place]

## 2018-08-05 ENCOUNTER — Other Ambulatory Visit: Payer: Self-pay | Admitting: Internal Medicine

## 2018-08-06 NOTE — Telephone Encounter (Signed)
Left detailed message informing pt of update. Pt needs to schedule an ov for blood work as well as OV. TSH has not been checked in 7 years.

## 2019-12-29 IMAGING — CR DG ABDOMEN ACUTE W/ 1V CHEST
4 series · 4 of 4 positions shown · non-contrast
Comparison: CT of the abdomen and pelvis performed 01/18/2018, and
chest radiograph performed 04/15/2012

CLINICAL DATA: Acute onset of nausea and lower abdominal pain. Lack
of bowel movements.

EXAM:
DG ABDOMEN ACUTE W/ 1V CHEST

[chest pa]
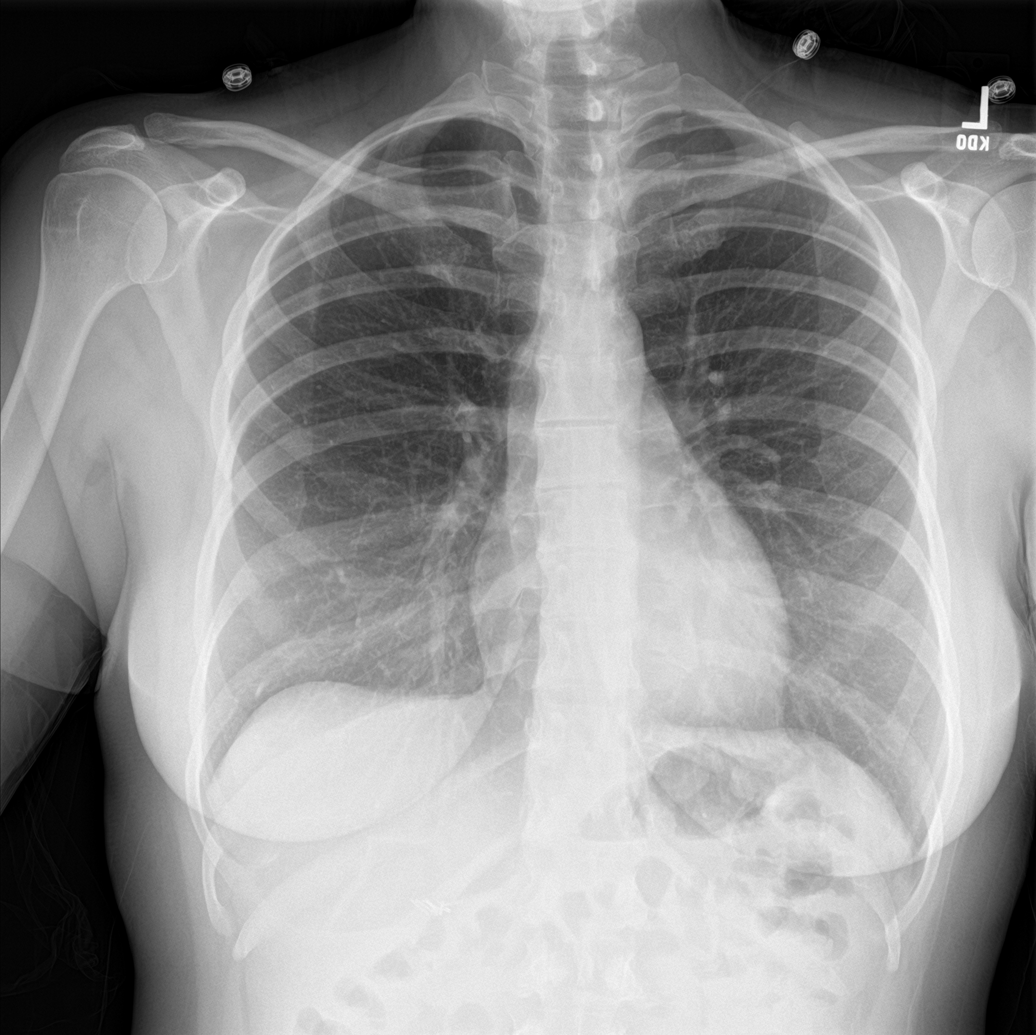

[abdomen erect]
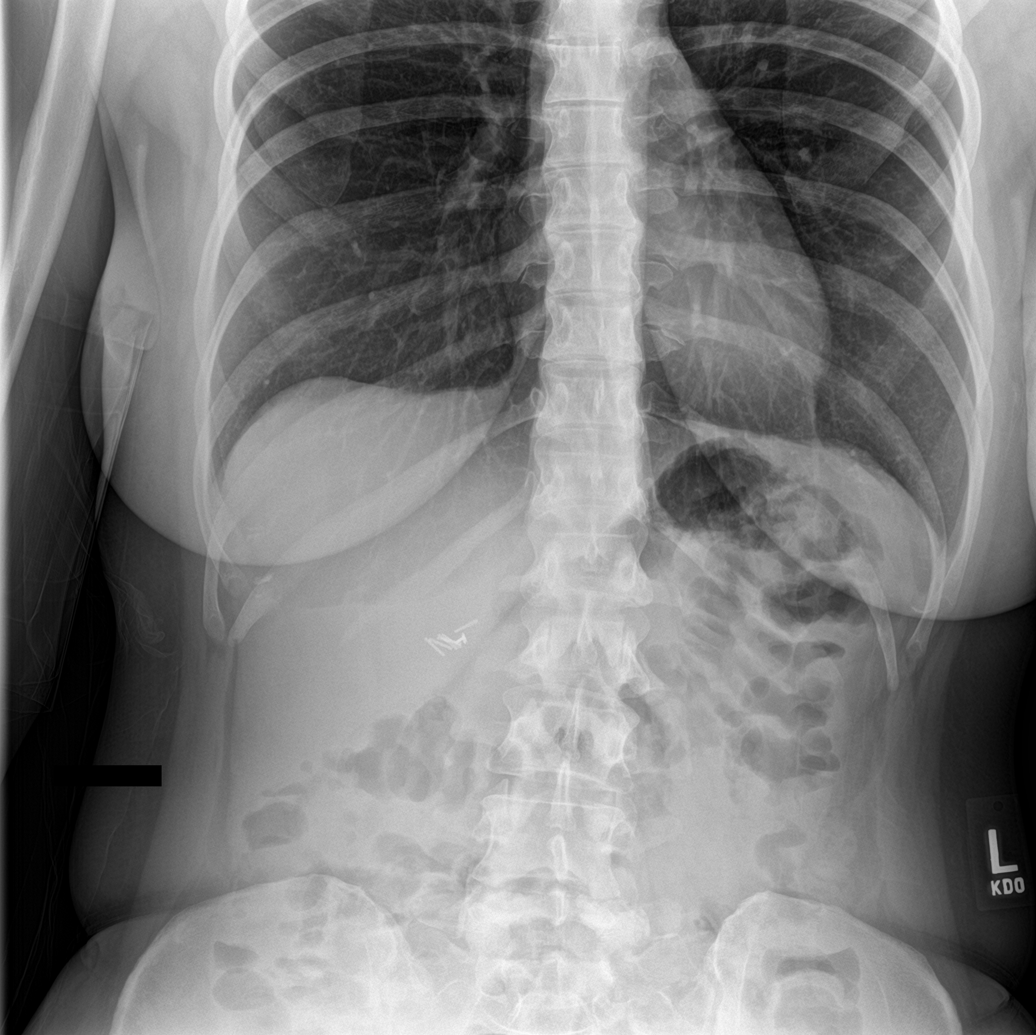

[abdomen supine (1 of 2)]
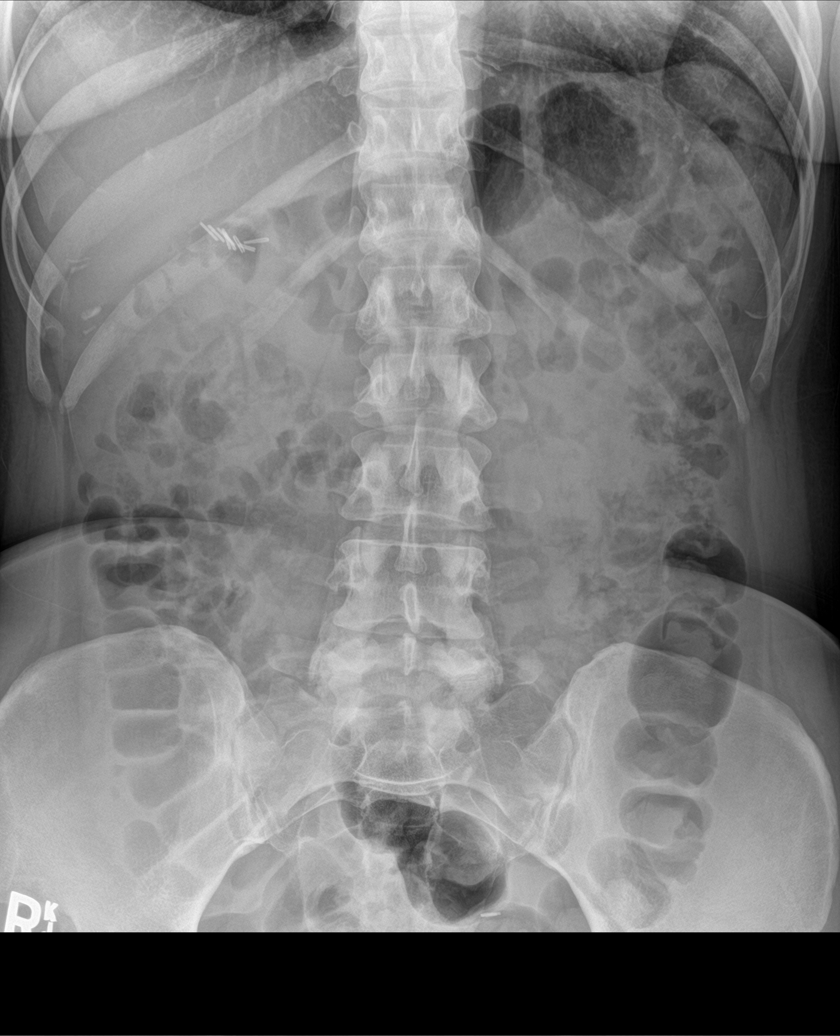

[abdomen supine (2 of 2)]
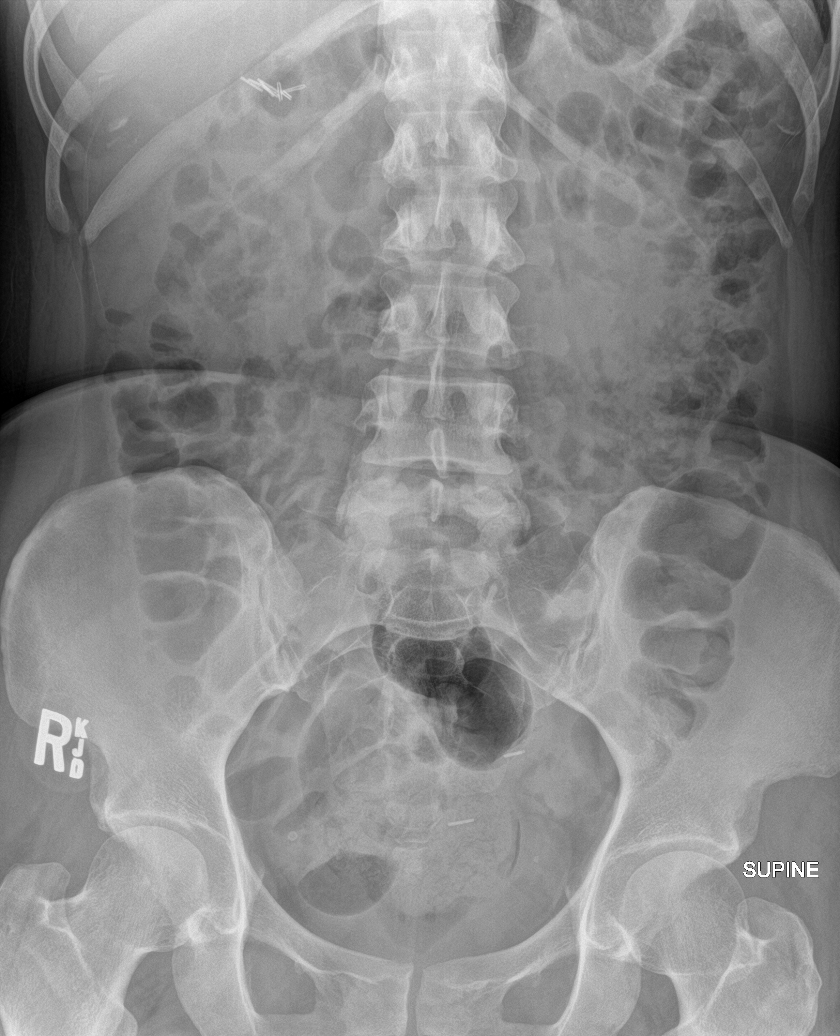

[4 of 4 positions shown; findings below may reference images not displayed]

FINDINGS: The lungs are well-aerated and clear. There is no evidence of focal
opacification, pleural effusion or pneumothorax. The
cardiomediastinal silhouette is within normal limits.

The visualized bowel gas pattern is unremarkable. Scattered stool
and air are seen within the colon; there is no evidence of small
bowel dilatation to suggest obstruction. No free intra-abdominal air
is identified on the provided upright view.

No acute osseous abnormalities are seen; the sacroiliac joints are
unremarkable in appearance. Clips are noted within the right upper
quadrant, reflecting prior cholecystectomy. Clips are noted at the
left hemipelvis.
IMPRESSION: 1. Unremarkable bowel gas pattern; no free intra-abdominal air seen.
Small amount of stool noted in the colon.
2. No acute cardiopulmonary process seen.
